# Patient Record
Sex: Female | Born: 1955 | Race: White | Hispanic: No | Marital: Married | Smoking: Former smoker
Health system: Southern US, Community
[De-identification: ages and names within clinical notes are randomized; demographics above are authoritative.]

## PROBLEM LIST (undated history)

## (undated) DIAGNOSIS — T7840XA Allergy, unspecified, initial encounter: Secondary | ICD-10-CM

## (undated) DIAGNOSIS — M1990 Unspecified osteoarthritis, unspecified site: Secondary | ICD-10-CM

## (undated) DIAGNOSIS — H409 Unspecified glaucoma: Secondary | ICD-10-CM

## (undated) HISTORY — PX: OOPHORECTOMY: SHX86

## (undated) HISTORY — DX: Allergy, unspecified, initial encounter: T78.40XA

## (undated) HISTORY — PX: ABDOMINAL HYSTERECTOMY: SHX81

## (undated) HISTORY — DX: Unspecified osteoarthritis, unspecified site: M19.90

---

## 1998-05-20 ENCOUNTER — Other Ambulatory Visit: Admission: RE | Admit: 1998-05-20 | Discharge: 1998-05-20 | Payer: Self-pay | Admitting: Obstetrics and Gynecology

## 1998-07-16 ENCOUNTER — Ambulatory Visit (HOSPITAL_COMMUNITY): Admission: RE | Admit: 1998-07-16 | Discharge: 1998-07-16 | Payer: Self-pay | Admitting: Gastroenterology

## 1999-08-14 ENCOUNTER — Other Ambulatory Visit: Admission: RE | Admit: 1999-08-14 | Discharge: 1999-08-14 | Payer: Self-pay | Admitting: Obstetrics and Gynecology

## 2000-05-25 ENCOUNTER — Other Ambulatory Visit: Admission: RE | Admit: 2000-05-25 | Discharge: 2000-05-25 | Payer: Self-pay | Admitting: Obstetrics and Gynecology

## 2000-07-07 ENCOUNTER — Encounter: Payer: Self-pay | Admitting: Urology

## 2000-07-09 ENCOUNTER — Ambulatory Visit (HOSPITAL_COMMUNITY): Admission: RE | Admit: 2000-07-09 | Discharge: 2000-07-09 | Payer: Self-pay | Admitting: Obstetrics and Gynecology

## 2001-03-29 ENCOUNTER — Observation Stay (HOSPITAL_COMMUNITY): Admission: RE | Admit: 2001-03-29 | Discharge: 2001-03-30 | Payer: Self-pay | Admitting: Obstetrics and Gynecology

## 2001-06-14 ENCOUNTER — Other Ambulatory Visit: Admission: RE | Admit: 2001-06-14 | Discharge: 2001-06-14 | Payer: Self-pay | Admitting: Obstetrics and Gynecology

## 2003-01-17 ENCOUNTER — Other Ambulatory Visit: Admission: RE | Admit: 2003-01-17 | Discharge: 2003-01-17 | Payer: Self-pay | Admitting: Obstetrics and Gynecology

## 2003-03-27 ENCOUNTER — Ambulatory Visit (HOSPITAL_COMMUNITY): Admission: RE | Admit: 2003-03-27 | Discharge: 2003-03-27 | Payer: Self-pay | Admitting: Obstetrics and Gynecology

## 2004-02-15 ENCOUNTER — Other Ambulatory Visit: Admission: RE | Admit: 2004-02-15 | Discharge: 2004-02-15 | Payer: Self-pay | Admitting: Obstetrics and Gynecology

## 2004-03-07 ENCOUNTER — Ambulatory Visit (HOSPITAL_COMMUNITY): Admission: RE | Admit: 2004-03-07 | Discharge: 2004-03-07 | Payer: Self-pay | Admitting: Gastroenterology

## 2005-03-31 ENCOUNTER — Other Ambulatory Visit: Admission: RE | Admit: 2005-03-31 | Discharge: 2005-03-31 | Payer: Self-pay | Admitting: Obstetrics and Gynecology

## 2007-08-09 IMAGING — CT CT ABD-PELV W/O CM
3 of 8 series · 13 of 42 positions shown, 19 images · non-contrast
Comparison: NONE

CLINICAL DATA: Right lower quadrant pain and fever.  Evaluate 
for  appendicitis. 

CT ABDOMEN AND PELVIS WITHOUT AND WITH INTRAVENOUS AND FOLLOWING 
ORAL  CONTRAST
TECHNIQUE: Multiple axial 5-millimeter thick slices at 
5-millimeter intervals were obtained from the lung base through 
the pelvis following the intravenous administration of 100 cc of 
Optiray 350 at a rate of 3 cc per second.  Oral contrast was 
administered as well.  Arterial and venous phase imaging was 
obtained in the upper abdomen with delayed images obtained through 
the pelvis.

[Series 2: wo · axial · 0.68mm/px · z∈[+774,+849]mm · 2 of 45 slices shown]
[im 15/45  soft-tissue]
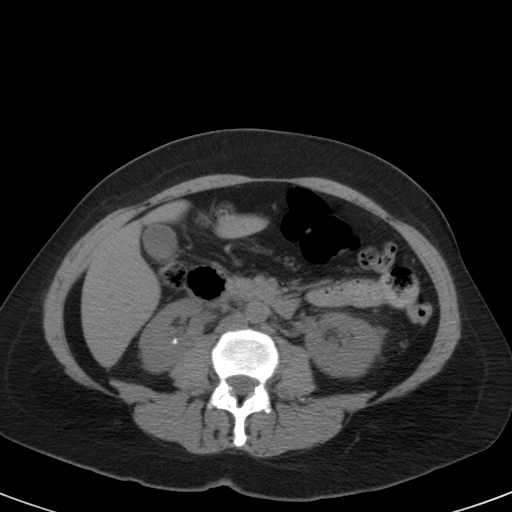
[im 30/45  soft-tissue]
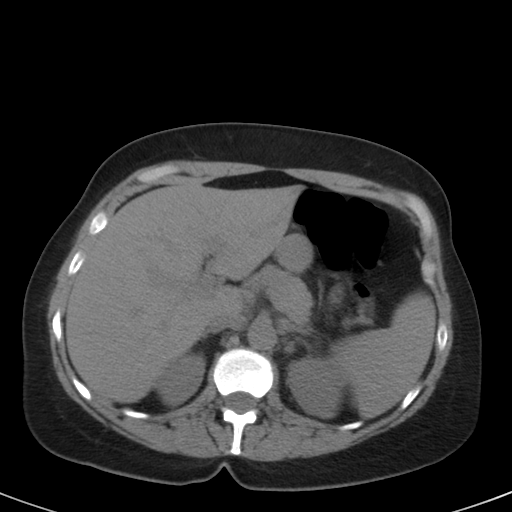

[Series 4: venous · axial · portal-venous · 0.68mm/px · z∈[+574,+880]mm · 8 of 132 slices shown, 13 images]
[im 15/132  soft-tissue]
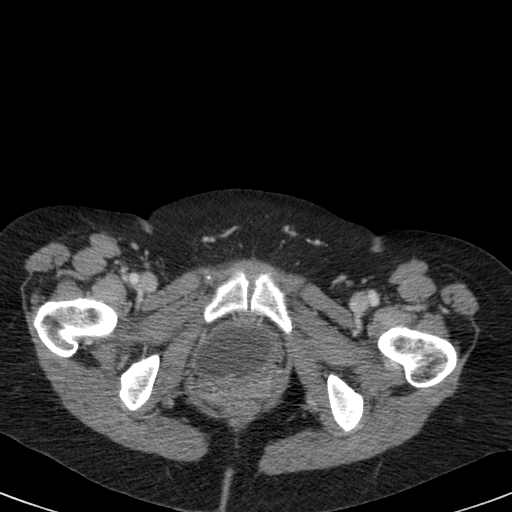
[im 15/132  bone]
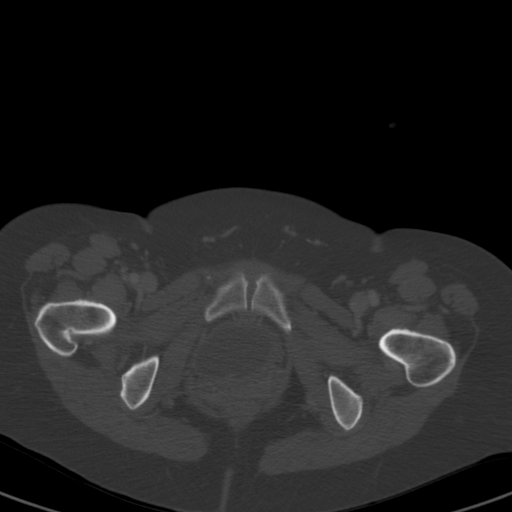
[im 30/132  soft-tissue]
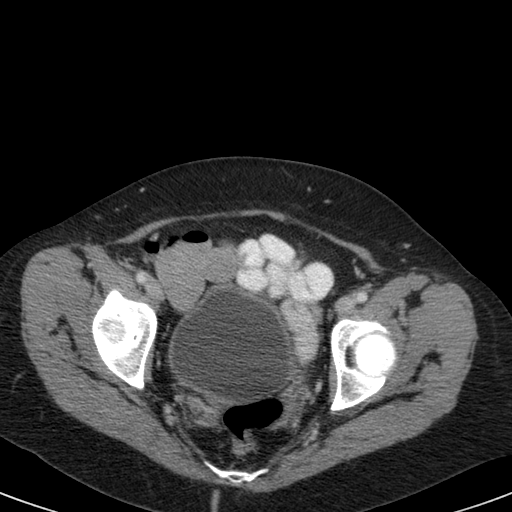
[im 44/132  soft-tissue]
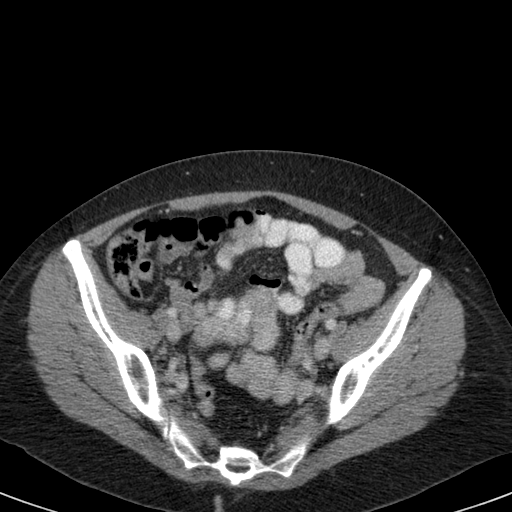
[im 59/132  soft-tissue]
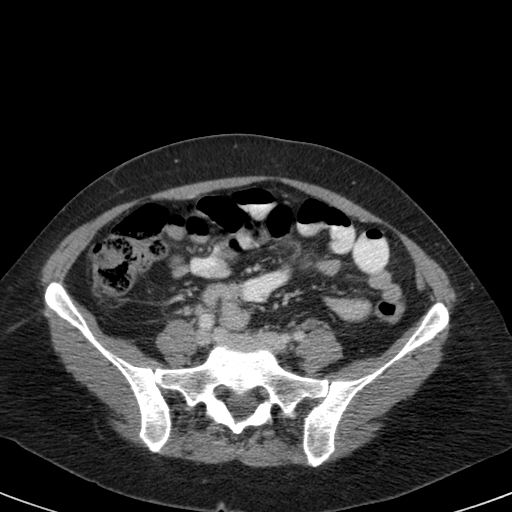
[im 73/132  soft-tissue]
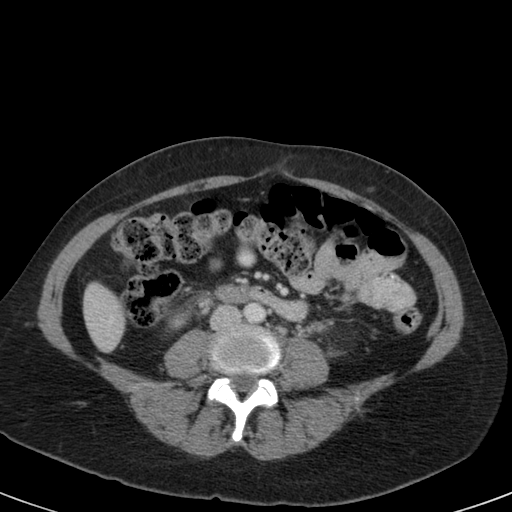
[im 73/132  lung]
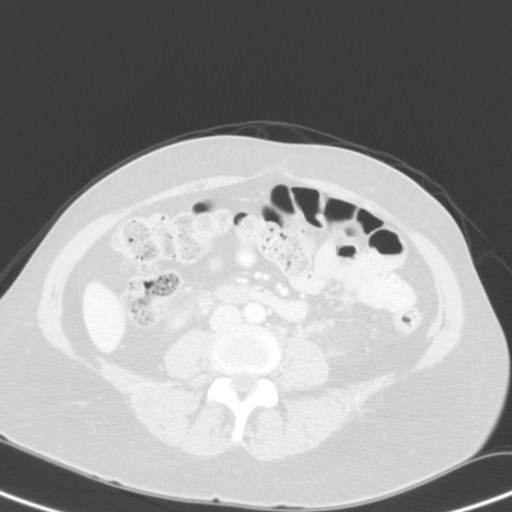
[im 88/132  soft-tissue]
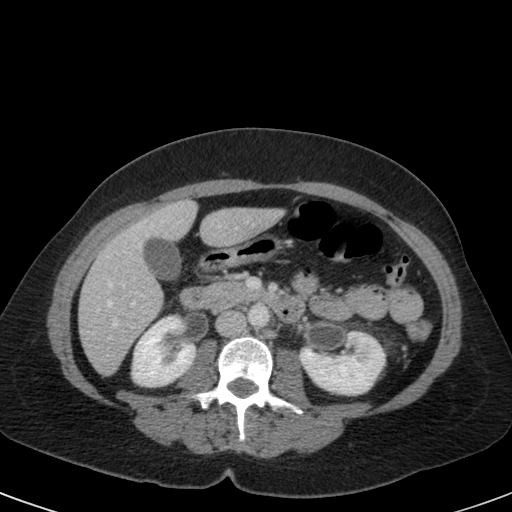
[im 88/132  lung]
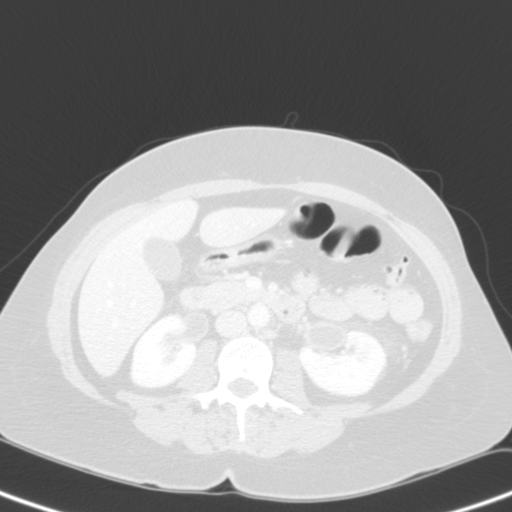
[im 102/132  soft-tissue]
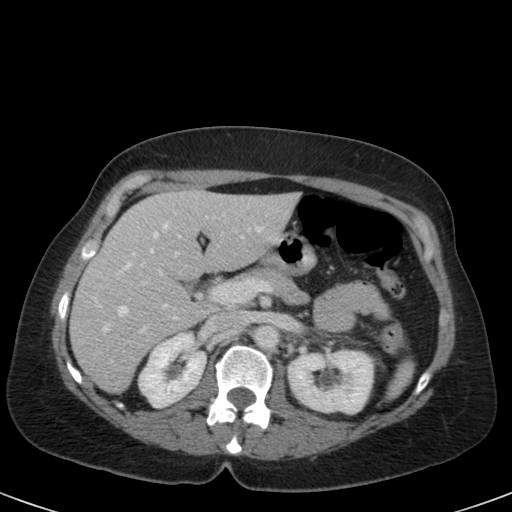
[im 102/132  lung]
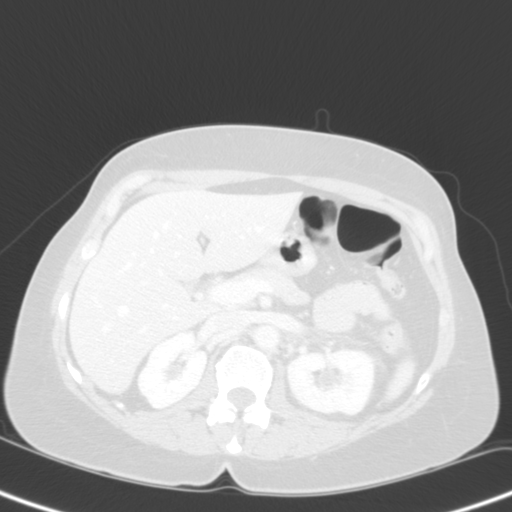
[im 117/132  soft-tissue]
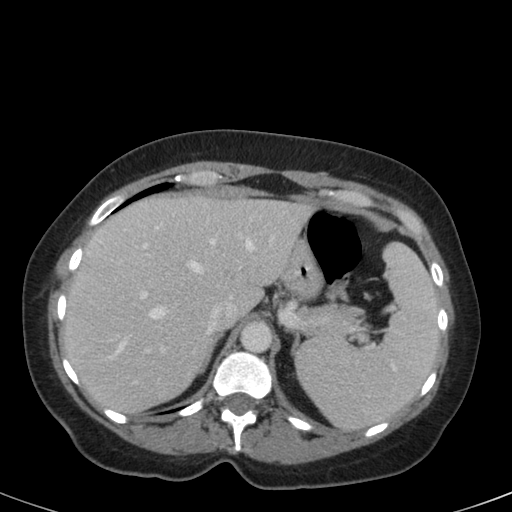
[im 117/132  lung]
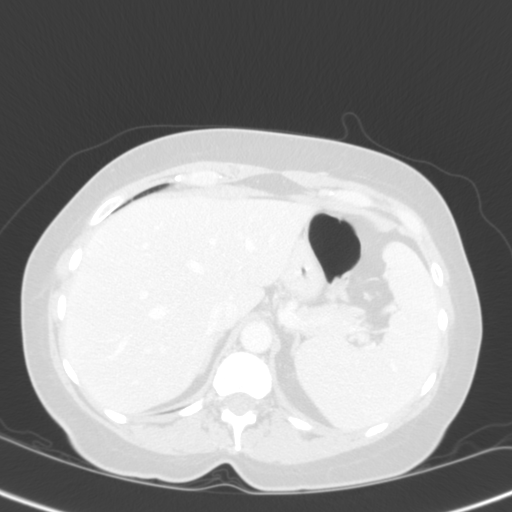

[Series 8058: coronals · coronal · 0.76mm/px · 3 of 72 slices shown, 4 images]
[im 24/72  soft-tissue]
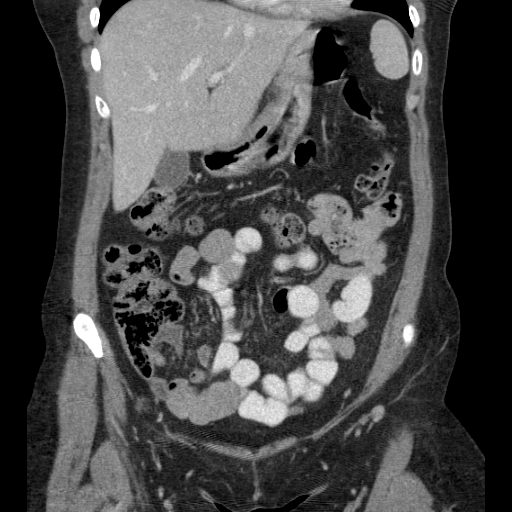
[im 32/72  soft-tissue]
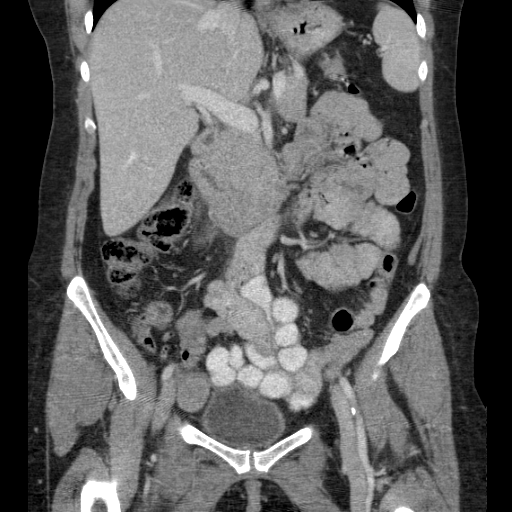
[im 32/72  bone]
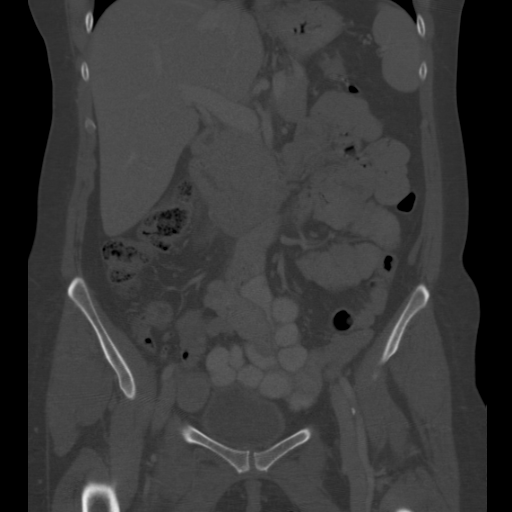
[im 40/72  soft-tissue]
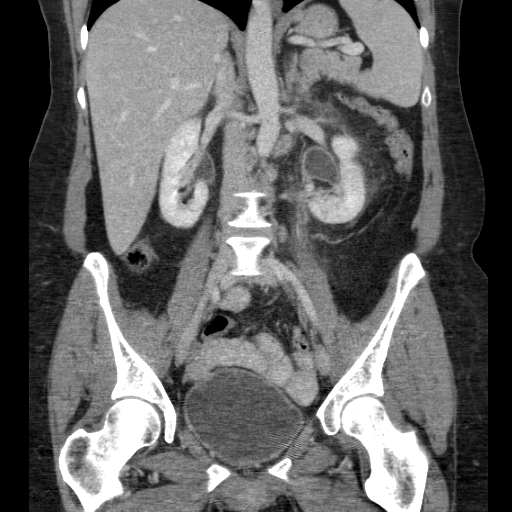

[13 of 42 positions shown; findings below may reference images not displayed]

FINDINGS: There is a 2-mm calculus in the lower pole of the right 
kidney.  No obstruction.  No gallstones. No left renal calculi. 
The liver, pancreas, and spleen are unremarkable. No upper 
abdominal or retroperitoneal mass, adenopathy, or aneurysm. No 
adrenal or renal mass or evidence of renal obstruction. No bowel, 
mesenteric, pelvic, or inguinal mass, adenopathy, or inflammatory 
process. No evidence of appendicitis, diverticulitis, hernia, or 
bowel obstruction. No lung base mass, infiltrate, edema, or 
effusion. No lytic or blastic lesions are identified.
IMPRESSION: Nonobstructing right lower pole renal calculus. No 
mass, adenopathy, or inflammatory process in the abdomen or 
07/20/2006 Dict Date: 07/20/2006  Tran Date: 07/20/2006 DAS  JLM

## 2011-07-01 ENCOUNTER — Emergency Department (INDEPENDENT_AMBULATORY_CARE_PROVIDER_SITE_OTHER)
Admission: EM | Admit: 2011-07-01 | Discharge: 2011-07-01 | Disposition: A | Discharge status: Home / Self Care | Payer: 59 | Source: Home / Self Care | Attending: Emergency Medicine | Admitting: Emergency Medicine

## 2011-07-01 ENCOUNTER — Encounter: Payer: Self-pay | Admitting: *Deleted

## 2011-07-01 DIAGNOSIS — R05 Cough: Secondary | ICD-10-CM

## 2011-07-01 DIAGNOSIS — I10 Essential (primary) hypertension: Secondary | ICD-10-CM

## 2011-07-01 HISTORY — DX: Unspecified glaucoma: H40.9

## 2011-07-01 MED ORDER — DIPHENHYDRAMINE HCL 25 MG PO CAPS: 25.0000 mg | ORAL_CAPSULE | Freq: Once | ORAL | Status: AC

## 2011-07-01 NOTE — ED Notes (Signed)
Pt  Has  Symptoms  Of  Cough  Sneezing  Headache     Sinus  Congestion   -    Has  Had  These  Symptoms    X  sev  Days    -  She  States she  Was  Seen  yest  At  Urgent  Care  And  Was  rx   avelox        And  steriod  Injection    Pt  Requests  Some  Cough  meds  -  She  Was  tild   yest that her BP  Was  High     She  Reports     She  Has  Took     Alka  Seltzer plus

## 2011-07-01 NOTE — ED Provider Notes (Signed)
History     CSN: [id number] Arrival date & time: 07/01/2011  3:54 PM   First MD Initiated Contact with Patient 07/01/11 1559      Chief Complaint  Patient presents with  . Cough    (Consider location/radiation/quality/duration/timing/severity/associated sxs/prior treatment) HPI Comments: Seen two days ago at another Urgent care, he gave me samples of Avalox for 5 days for a sinus infection and bronchitis, also got a steroid shot, was noted to have my BP high, Im still coughing and Im congested"  Patient is a 55 y.o. female presenting with cough. The history is provided by the patient.  Cough This is a new problem. The current episode started more than 1 week ago. The problem has not changed since onset.The cough is non-productive. Associated symptoms include rhinorrhea and sore throat. Pertinent negatives include no shortness of breath. She has tried decongestants and cough syrup for the symptoms. The treatment provided mild relief. She is not a smoker. Her past medical history does not include pneumonia.    Past Medical History  Diagnosis Date  . Glaucoma (increased eye pressure)     Past Surgical History  Procedure Date  . Abdominal hysterectomy   . Oophorectomy     Family History  Problem Relation Age of Onset  . Heart failure Mother   . Rheum arthritis Mother   . Heart failure Father   . COPD Father     History  Substance Use Topics  . Smoking status: Current Everyday Smoker  . Smokeless tobacco: Not on file  . Alcohol Use: Yes     SOCIALLY    OB History    Grav Para Term Preterm Abortions TAB SAB Ect Mult Living                  Review of Systems  Constitutional: Negative for fever and fatigue.  HENT: Positive for sore throat and rhinorrhea.   Eyes: Negative for discharge.  Respiratory: Positive for cough. Negative for shortness of breath.     Allergies  Review of patient's allergies indicates no known allergies.  Home Medications   Current  Outpatient Rx  Name Route Sig Dispense Refill  . MOXIFLOXACIN HCL 400 MG PO TABS Oral Take 400 mg by mouth daily.      Marland Kitchen PHENYLEPH-CPM-DM-APAP 12-09-08-325 MG PO CAPS Oral Take by mouth.      Marland Kitchen DIPHENHYDRAMINE HCL 25 MG PO CAPS Oral Take 1 capsule (25 mg total) by mouth once. One dose at night for 7 days 7 capsule 0    BP 151/97  Pulse 81  Temp(Src) 98.1 F (36.7 C) (Oral)  Resp 17  SpO2 99%  Physical Exam  Nursing note and vitals reviewed. Constitutional: She is oriented to person, place, and time. She appears well-developed and well-nourished.  HENT:  Head: Normocephalic.  Mouth/Throat: Uvula is midline and mucous membranes are normal. No uvula swelling. Posterior oropharyngeal erythema present. No oropharyngeal exudate or posterior oropharyngeal edema.  Eyes: Pupils are equal, round, and reactive to light.  Pulmonary/Chest: Effort normal and breath sounds normal.  Neurological: She is alert and oriented to person, place, and time. She has normal reflexes.  Skin: Skin is warm.    ED Course  Procedures (including critical care time)  Labs Reviewed - No data to display No results found.   1. Cough   2. Hypertension       MDM  Cough and BP recheck- taking avelox and steroid shot- also for BP repeata advised by PCP  nurse?        Jimmie Molly, MD 07/01/11 [phone]

## 2012-05-21 ENCOUNTER — Ambulatory Visit: Payer: 59 | Admitting: Family Medicine

## 2012-05-21 VITALS — BP 162/88 | HR 67 | Temp 97.9°F | Resp 16 | Ht 62.0 in | Wt 153.6 lb

## 2012-05-21 DIAGNOSIS — L259 Unspecified contact dermatitis, unspecified cause: Secondary | ICD-10-CM

## 2012-05-21 DIAGNOSIS — R030 Elevated blood-pressure reading, without diagnosis of hypertension: Secondary | ICD-10-CM

## 2012-05-21 MED ORDER — PREDNISONE 20 MG PO TABS: ORAL_TABLET | ORAL | Status: AC

## 2012-05-21 NOTE — Progress Notes (Signed)
[address]   [place], [place]  [address]   [phone]  Subjective:    Patient ID: Brandi Williams, female    DOB: 1956-04-28, 56 y.o.   MRN: [id number]  HPI This 56 y.o. female presents for evaluation of rash.  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  Onset one week ago.  B legs medial aspect.  No other lesions.  +itching.  Calamine lotion; has also used apple cider vinegar.  Never has had poison ivy.  Applying hydrocortisone.    2.  Blood pressure elevated:  Issue for past year. PCP has been following; checks periodically at daughter's house.  Eats a lot of salt.  PCP: [facility] in [place] PMH:  Glaucoma, HTN, OA toes, hands, knees Psurg: hysterectomy DUB. All:  NKDA Medications: glaucoma drops Social:  Married; 2 children, 1 grandchild.  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  +tobacco; no alcohol.    Review of Systems  Constitutional: Negative for fever, chills, diaphoresis and fatigue.  Cardiovascular: Negative for chest pain and leg swelling.  Skin: Positive for rash. Negative for color change, pallor and wound.  Neurological: Negative for dizziness, weakness, light-headedness, numbness and headaches.    Past Medical History  Diagnosis Date  . Glaucoma (increased eye pressure)     Past Surgical History  Procedure Date  . Oophorectomy   . Abdominal hysterectomy     DUB    Prior to Admission medications   Medication Sig Start Date End Date Taking? Authorizing Provider  moxifloxacin (AVELOX) 400 MG tablet Take 400 mg by mouth daily.      Historical Provider, MD  Phenyleph-CPM-DM-APAP (ALKA-SELTZER PLUS COLD & COUGH) 12-09-08-325 MG CAPS Take by mouth.      Historical Provider, MD  predniSONE (DELTASONE) 20 MG tablet 2 daily x 4 days, then 1 daily x 4 days 05/21/12   Ethelda Chick, MD    No Known Allergies  History   Social History  .  Marital Status: Married    Spouse Name: N/A    Number of Children: N/A  . Years of Education: N/A   Occupational History  . Not on file.   Social History Main Topics  . Smoking status: Current Every Day Smoker  . Smokeless tobacco: Not on file  . Alcohol Use: Yes     SOCIALLY  . Drug Use:   . Sexually Active:    Other Topics Concern  . Not on file   Social History Narrative   Marital status: married.   Children: 2; one grandchild.   Employment: works for [employer] in [employer] since 2007.   Tobacco: yes   Alcohol: yes   Exercise: no formal exercise; job physically demanding.    Family History  Problem Relation Age of Onset  . Heart failure Mother   . Rheum arthritis Mother   . Heart failure Father   . COPD Father        Objective:   Physical Exam  Nursing note and vitals reviewed. Constitutional: She appears well-developed and well-nourished. No distress.  Eyes: Conjunctivae normal are normal. Pupils are equal, round, and reactive to light.  Pulmonary/Chest: Effort normal.  Skin: Rash noted. She is not diaphoretic. There is erythema. No pallor.       Maculopapular rash with scattered B medial legs/thighs; no vesicles or pustules; minimal induration.  No streaking.  Psychiatric: She has a normal mood and affect. Her behavior is normal. Judgment and thought content normal.       Assessment & Plan:   1. Contact dermatitis  predniSONE (DELTASONE) 20 MG tablet  2. Blood pressure elevated      1. Contact Dermatitis: New.  B medial thighs.  Consistent with contact dermatitis possibly from poison ivy.  Recommend Claritin 10mg  daily; Benadryl 25mg  qhs.  Topical Benadryl cream.  Rx for Prednisone provided.  Local wound care.  RTC if no improvement in two weeks. 2. Blood pressure elevated:  New to this provider; chronic issue for patient for past year.  Recommend weight loss, exercise, low salt diet.  Check blood pressure weekly for next several months.  RTC or PCP  if BP> 140/90 consistently.

## 2012-05-21 NOTE — Patient Instructions (Addendum)
1. Contact dermatitis  predniSONE (DELTASONE) 20 MG tablet  2. Blood pressure elevated      FOR RASH: START CLARITIN (LORATADINE) 10MG  ONE DAILY FOR ITCHING. START BENADRYL 25MG  ONE TABLET AT BEDTIME FOR ITCHING. START BENADRYL CREAM APPLY TO AFFECTED AREAS AS NEEDED.  START PREDNISONE TAPER IF WORSENS OR PERSISTS.    FOR BLOOD PRESSURE: RECOMMEND 10-20 POUND WEIGHT LOSS. AVOID ADDING SALT TO FOODS. RECOMMEND DAILY EXERCISE.   CHECK BLOOD PRESSURE WEEKLY.

## 2012-05-23 ENCOUNTER — Encounter: Payer: Self-pay | Admitting: Family Medicine

## 2012-06-04 NOTE — Progress Notes (Signed)
Reviewed and agree.

## 2012-07-08 ENCOUNTER — Encounter (HOSPITAL_COMMUNITY): Payer: Self-pay | Admitting: Emergency Medicine

## 2012-07-08 ENCOUNTER — Emergency Department (INDEPENDENT_AMBULATORY_CARE_PROVIDER_SITE_OTHER)
Admission: EM | Admit: 2012-07-08 | Discharge: 2012-07-08 | Disposition: A | Discharge status: Home / Self Care | Payer: 59 | Source: Home / Self Care | Attending: Emergency Medicine | Admitting: Emergency Medicine

## 2012-07-08 ENCOUNTER — Emergency Department (HOSPITAL_COMMUNITY)
Admit: 2012-07-08 | Discharge: 2012-07-08 | Disposition: A | Discharge status: Home / Self Care | Payer: 59 | Attending: Emergency Medicine | Admitting: Emergency Medicine

## 2012-07-08 DIAGNOSIS — IMO0001 Reserved for inherently not codable concepts without codable children: Secondary | ICD-10-CM

## 2012-07-08 DIAGNOSIS — M7918 Myalgia, other site: Secondary | ICD-10-CM

## 2012-07-08 DIAGNOSIS — M25519 Pain in unspecified shoulder: Secondary | ICD-10-CM | POA: Insufficient documentation

## 2012-07-08 NOTE — ED Provider Notes (Signed)
Medical screening examination/treatment/procedure(s) were performed by non-physician practitioner and as supervising physician I was immediately available for consultation/collaboration.  Leslee Home, M.D.   Reuben Likes, MD 07/08/12 2046

## 2012-07-08 NOTE — ED Provider Notes (Signed)
History     CSN: [id number]  Arrival date & time 07/08/12  1653   First MD Initiated Contact with Patient 07/08/12 1817      Chief Complaint  Patient presents with  . Shoulder Pain    (Consider location/radiation/quality/duration/timing/severity/associated sxs/prior treatment) The history is provided by the patient.   Brandi Williams is a 56 y.o. female who complains of left clavicle pain for 1 day. Mechanism of injury: none noted.  Reports pain is worse with palpation.  denies change in arm or hand function or strength.  Applied heat to area, not medication taken.  Symptoms have been increasingly worse.  No prior history of related problems with shoulder or clavicle.  Currently works in Patent examiner for a hotel.    Past Medical History  Diagnosis Date  . Glaucoma (increased eye pressure)   . Allergy   . Arthritis     Past Surgical History  Procedure Date  . Oophorectomy   . Abdominal hysterectomy     DUB    Family History  Problem Relation Age of Onset  . Heart failure Mother   . Rheum arthritis Mother   . Heart failure Father   . COPD Father   . Cancer Sister     Lung  . Cancer Brother     colon    History  Substance Use Topics  . Smoking status: Current Every Day Smoker -- 1.0 packs/day    Types: Cigarettes  . Smokeless tobacco: Not on file  . Alcohol Use: Yes     Comment: SOCIALLY    OB History    Grav Para Term Preterm Abortions TAB SAB Ect Mult Living                  Review of Systems  Musculoskeletal: Positive for arthralgias.  All other systems reviewed and are negative.    Allergies  Review of patient's allergies indicates no known allergies.  Home Medications   Current Outpatient Rx  Name  Route  Sig  Dispense  Refill  . MOXIFLOXACIN HCL 400 MG PO TABS   Oral   Take 400 mg by mouth daily.           Marland Kitchen PHENYLEPH-CPM-DM-APAP 12-09-08-325 MG PO CAPS   Oral   Take by mouth.           Marland Kitchen PREDNISONE 20 MG PO TABS      2  daily x 4 days, then 1 daily x 4 days   12 tablet   0     BP 144/90  Pulse 74  Temp 98.5 F (36.9 C) (Oral)  Resp 18  SpO2 100%  Physical Exam  Nursing note and vitals reviewed. Constitutional: She is oriented to person, place, and time. Vital signs are normal. She appears well-developed and well-nourished. She is active and cooperative.  HENT:  Head: Normocephalic.  Eyes: Conjunctivae normal are normal. Pupils are equal, round, and reactive to light. No scleral icterus.  Neck: Trachea normal and normal range of motion. Neck supple.  Cardiovascular: Normal rate, regular rhythm, normal heart sounds and intact distal pulses.   Pulmonary/Chest: Effort normal and breath sounds normal.    Lymphadenopathy:    She has no cervical adenopathy.  Neurological: She is alert and oriented to person, place, and time. She has normal strength. No cranial nerve deficit or sensory deficit. Coordination and gait normal. GCS eye subscore is 4. GCS verbal subscore is 5. GCS motor subscore is 6.  Skin: Skin  is warm and dry.  Psychiatric: She has a normal mood and affect. Her speech is normal and behavior is normal. Judgment and thought content normal. Cognition and memory are normal.    ED Course  Procedures (including critical care time)  Labs Reviewed - No data to display Dg Clavicle Left  07/08/2012  *RADIOLOGY REPORT*  Clinical Data: Medial left clavicle pain.  No known injuries.  LEFT CLAVICLE - 2+ VIEWS  Comparison: None.  Findings: No evidence of acute, subacute, or healed fractures.  No intrinsic abnormalities involving the clavicle.  Sternoclavicular joint and acromioclavicular joint intact; note made of vacuum phenomenon in the acromioclavicular joint, a benign finding.  IMPRESSION: No acute, subacute, or significant abnormalities.   Original Report Authenticated By: Hulan Saas, M.D.      1. Musculoskeletal pain       MDM  Pt insists on xray of clavicle despite no apparent  injury.   nsaids or tylenol prn, rtc prn    .  Johnsie Kindred, NP 07/08/12 1943

## 2012-07-08 NOTE — ED Notes (Signed)
Pt c/o left clavicle pain x 1 week.... Noticed a rise on the medial part of clavicle since yesterday.... Describes minor pain w/activity... Denies: inj/trauma, fevers, vomiting, nauseas, diarrhea... Pt is alert w/no signs of distress.

## 2012-07-08 NOTE — ED Notes (Signed)
Patient transported to X-ray 

## 2013-05-03 ENCOUNTER — Ambulatory Visit (HOSPITAL_COMMUNITY): Admission: RE | Admit: 2013-05-03 | Payer: 59 | Source: Ambulatory Visit | Admitting: Obstetrics and Gynecology

## 2013-05-03 ENCOUNTER — Encounter (HOSPITAL_COMMUNITY): Admission: RE | Payer: Self-pay | Source: Ambulatory Visit

## 2013-05-03 SURGERY — ANTERIOR (CYSTOCELE) AND POSTERIOR REPAIR (RECTOCELE)
Anesthesia: Choice

## 2013-07-29 IMAGING — CR DG CLAVICLE*L*
2 series · 2 of 2 positions shown · non-contrast
Comparison: None.

CLINICAL DATA: Medial left clavicle pain.  No known injuries.

LEFT CLAVICLE - 2+ VIEWS

[w clavicle ap left *]
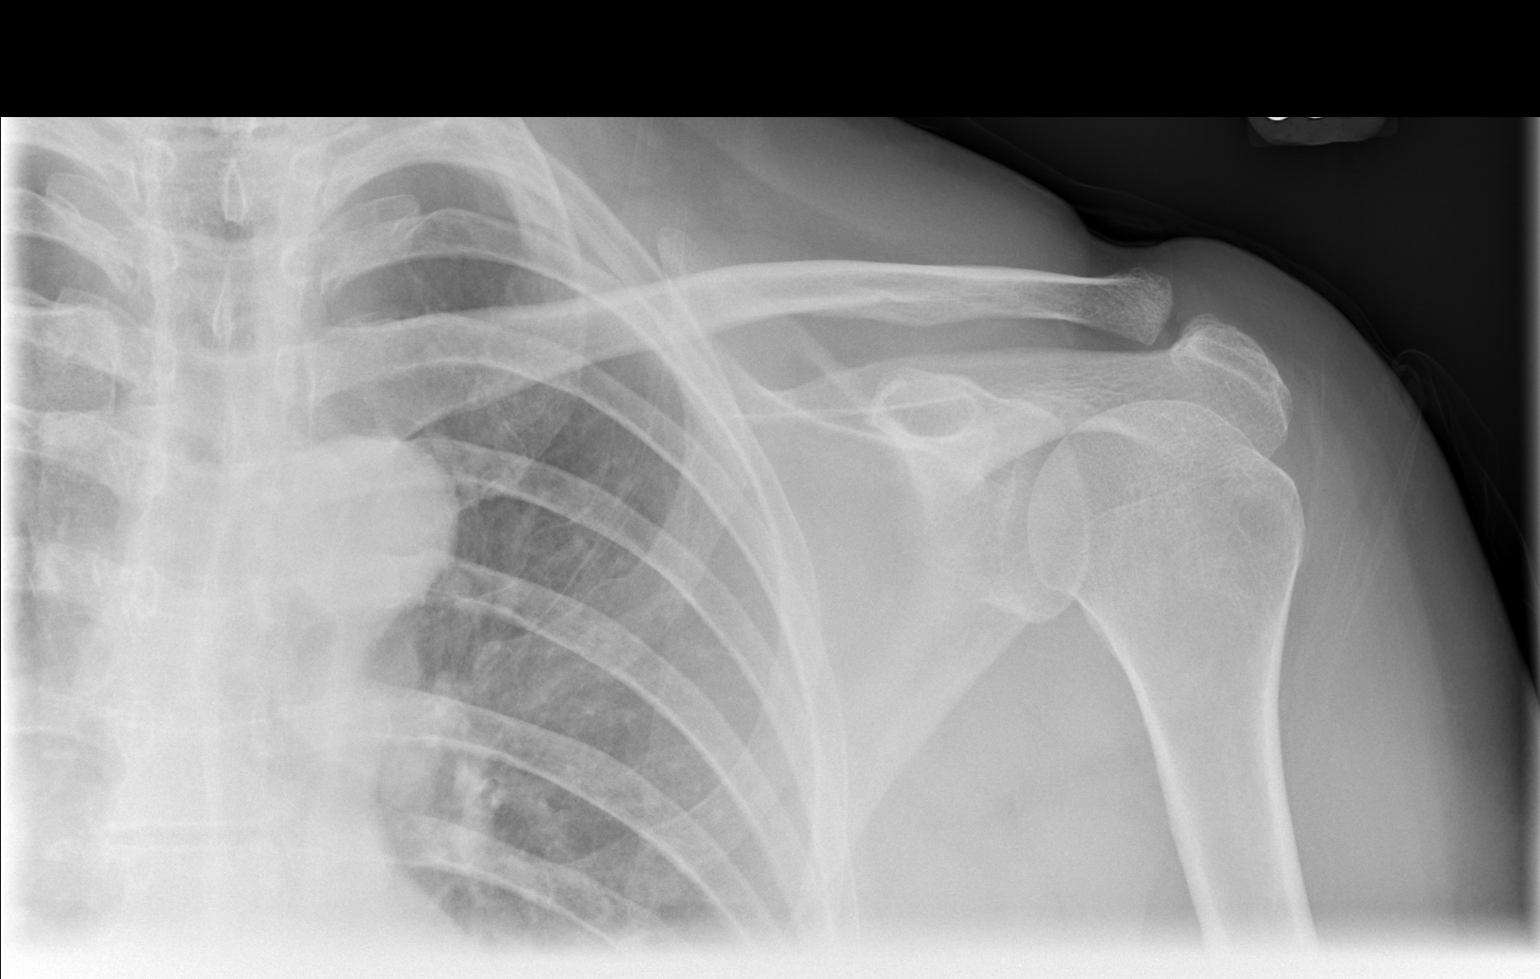

[w clavicle tangential left *]
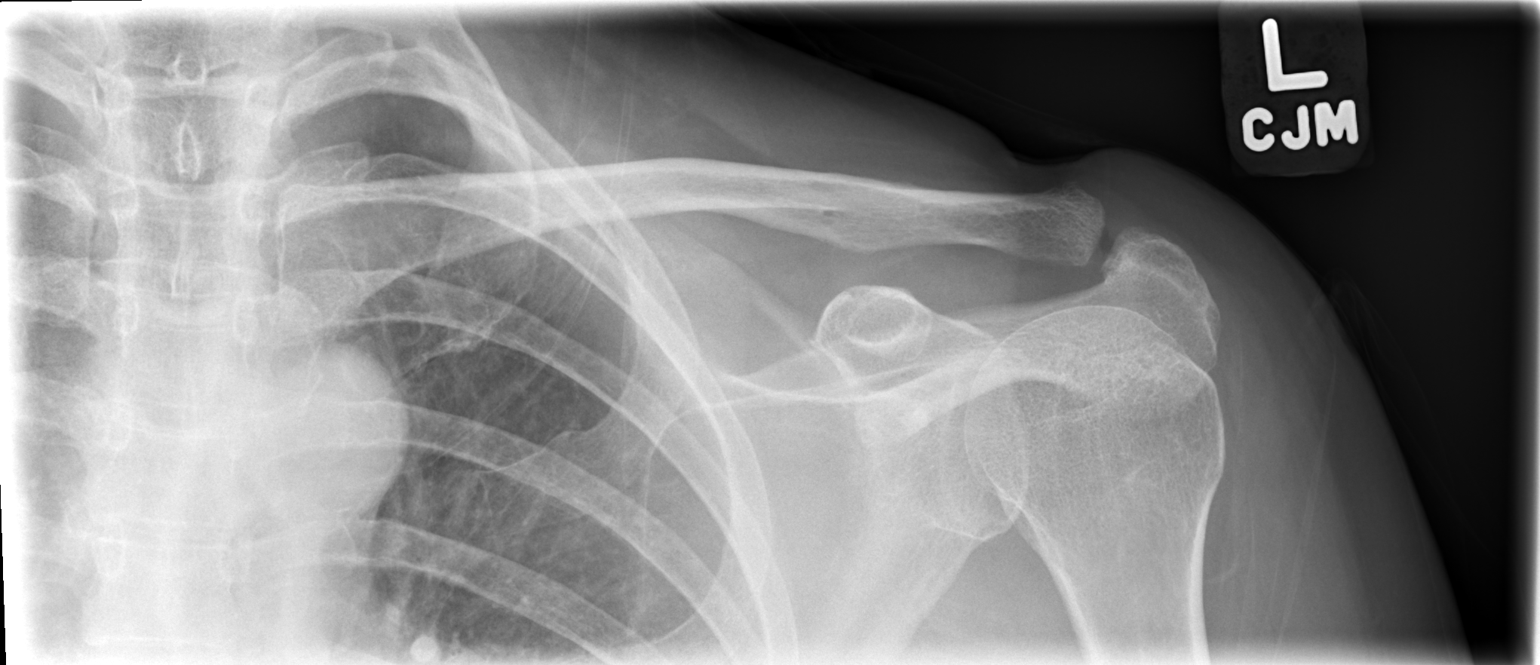

[2 of 2 positions shown; findings below may reference images not displayed]

FINDINGS: No evidence of acute, subacute, or healed fractures.  No
intrinsic abnormalities involving the clavicle.  Sternoclavicular
joint and acromioclavicular joint intact; note made of vacuum
phenomenon in the acromioclavicular joint, a benign finding.
IMPRESSION: No acute, subacute, or significant abnormalities.

## 2013-11-20 ENCOUNTER — Ambulatory Visit (INDEPENDENT_AMBULATORY_CARE_PROVIDER_SITE_OTHER): Payer: 59

## 2013-11-20 VITALS — BP 130/84 | HR 80 | Resp 18

## 2013-11-20 DIAGNOSIS — R52 Pain, unspecified: Secondary | ICD-10-CM

## 2013-11-20 DIAGNOSIS — M1990 Unspecified osteoarthritis, unspecified site: Secondary | ICD-10-CM

## 2013-11-20 DIAGNOSIS — M109 Gout, unspecified: Secondary | ICD-10-CM

## 2013-11-20 DIAGNOSIS — M2010 Hallux valgus (acquired), unspecified foot: Secondary | ICD-10-CM

## 2013-11-20 DIAGNOSIS — M2040 Other hammer toe(s) (acquired), unspecified foot: Secondary | ICD-10-CM

## 2013-11-20 DIAGNOSIS — M7750 Other enthesopathy of unspecified foot: Secondary | ICD-10-CM

## 2013-11-20 DIAGNOSIS — M779 Enthesopathy, unspecified: Secondary | ICD-10-CM

## 2013-11-20 DIAGNOSIS — M778 Other enthesopathies, not elsewhere classified: Secondary | ICD-10-CM

## 2013-11-20 MED ORDER — MELOXICAM 15 MG PO TABS: 15.0000 mg | ORAL_TABLET | Freq: Every day | ORAL | Status: AC

## 2013-11-20 MED ORDER — TRIAMCINOLONE ACETONIDE 10 MG/ML IJ SUSP: 10.0000 mg | Freq: Once | INTRAMUSCULAR | Status: AC

## 2013-11-20 NOTE — Progress Notes (Signed)
   Subjective:    Patient ID: Brandi Williams, female    DOB: 1955/12/19, 58 y.o.   MRN: [id number]  HPI my left foot has been hurting for a week now and throb on the big toe and the 2nd toe and hurts with shoes and is swollen     Review of Systems  Psychiatric/Behavioral: Positive for behavioral problems.  All other systems reviewed and are negative.      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 X-rays taken at this time reveal digital contractures HAV deformity pin fixation first metatarsal left foot there is significant fracture lesser digits 2345 no signs of fracture or osseous abnormality generalized osteopenic changes identified no history of recent trauma or other changes patient did a lot of walking shoes may be inadequate as far support or stability. The shoes wearing a new balance type shoe to       Assessment & Plan:  Assessment this time based on clinical findings vascular status is intact pedal pulses palpable epicritic and proprioceptive sensations intact and symmetric patient does have osteoarthropathy cannot rule out possibly gout attack which swelling affecting his second toe cannot rule out or capsulitis of the second MTP and early neuroma symptomology. Versus a gout attack. Also cannot rule out rheumatoid arthropathy as well my request for arthritis palliative and this time patient will have blood work done with the next day  as instructed prescription for meloxicam is also issued 50 mg once daily suggested posse some ice for a day or to help calm down the foot maintain a good stiff soled shoe no barefoot or flimsy shoes currently wearing flip-flops and ice she should not or flip-flops. Recheck in one week for followup advised to contact is any change difficulties in the interim.  Alvan Dame DPM

## 2013-11-20 NOTE — Patient Instructions (Signed)
Gout  Gout is when your joints become red, sore, and swell (inflammed). This is caused by the buildup of uric acid crystals in the joints. Uric acid is a chemical that is normally in the blood. If the level of uric acid gets too high in the blood, these crystals form in your joints and tissues. Over time, these crystals can form into masses near the joints and tissues. These masses can destroy bone and cause the bone to look misshapen (deformed).  HOME CARE   · Do not take aspirin for pain.  · Only take medicine as told by your doctor.  · Rest the joint as much as you can. When in bed, keep sheets and blankets off painful areas.  · Keep the sore joints raised (elevated).  · Put warm or cold packs on painful joints. Use of warm or cold packs depends on which works best for you.  · Use crutches if the painful joint is in your leg.  · Drink enough fluids to keep your pee (urine) clear or pale yellow. Limit alcohol, sugary drinks, and drinks with fructose in them.  · Follow your diet instructions. Pay careful attention to how much protein you eat. Include fruits, vegetables, whole grains, and fat-free or low-fat milk products in your daily diet. Talk to your doctor or dietician about the use of coffee, vitamin C, and cherries. These may help lower uric acid levels.  · Keep a healthy body weight.  GET HELP RIGHT AWAY IF:   · You have watery poop (diarrhea), throw up (vomit), or have any side effects from medicines.  · You do not feel better in 24 hours, or you are getting worse.  · Your joint becomes suddenly more tender, and you have chills or a fever.  MAKE SURE YOU:   · Understand these instructions.  · Will watch your condition.  · Will get help right away if you are not doing well or get worse.  Document Released: 05/05/2008 Document Revised: 11/21/2012 Document Reviewed: 11/04/2009  [employer]® Patient Information ©2014 [employer].

## 2013-11-21 ENCOUNTER — Other Ambulatory Visit: Payer: Self-pay

## 2013-11-21 LAB — C-REACTIVE PROTEIN: CRP: <0.5 mg/dL (ref <0.60)

## 2013-11-21 LAB — RHEUMATOID FACTOR: Rhuematoid fact SerPl-aCnc: 18 IU/mL — ABNORMAL HIGH (ref <=14)

## 2013-11-21 LAB — URIC ACID: URIC ACID, SERUM: 3.7 mg/dL (ref 2.4–7.0)

## 2013-11-21 LAB — SEDIMENTATION RATE: Sed Rate: 8 mm/hr (ref 0–22)

## 2013-11-22 LAB — ANA: Anti Nuclear Antibody(ANA): NEGATIVE

## 2013-11-24 ENCOUNTER — Telehealth: Payer: Self-pay | Admitting: *Deleted

## 2013-11-24 NOTE — Telephone Encounter (Signed)
I left the patient a message that her Uric Acid level was normal.  Rheumatoid Factor is elevated which may indicate Rheumatoid Arthritis.  He will discuss it in more detail on next visit which is 11/28/13.

## 2013-11-24 NOTE — Telephone Encounter (Signed)
Yes I did review her lab results uric acid levels were normal patient had a slightly elevated rheumatoid factor indicating the possibility of rheumatoid arthritis. Her sedimentation rate and other findings were all in normal range. If she continues to have pain and arthritic pain she may be candidate for referral to a rheumatologist. I suggest she keep her followup appointment with me to discuss the progress or changes in the results of her testing and we can make arrangements for her to see appropriate rheumatologist as needed  Alvan Dame DPM

## 2013-11-24 NOTE — Telephone Encounter (Signed)
Have they gotten results of my blood test?

## 2013-11-28 ENCOUNTER — Ambulatory Visit (INDEPENDENT_AMBULATORY_CARE_PROVIDER_SITE_OTHER): Payer: 59

## 2013-11-28 VITALS — BP 142/88 | HR 67 | Resp 12

## 2013-11-28 DIAGNOSIS — M069 Rheumatoid arthritis, unspecified: Secondary | ICD-10-CM

## 2013-11-28 DIAGNOSIS — M1990 Unspecified osteoarthritis, unspecified site: Secondary | ICD-10-CM

## 2013-11-28 DIAGNOSIS — M2010 Hallux valgus (acquired), unspecified foot: Secondary | ICD-10-CM

## 2013-11-28 NOTE — Patient Instructions (Signed)
Rheumatoid Arthritis Rheumatoid arthritis is a long-term (chronic) inflammatory disease that causes pain, swelling, and stiffness of the joints. It can affect the entire body, including the eyes and lungs. The effects of rheumatoid arthritis vary widely among those with the condition. CAUSES  The cause of rheumatoid arthritis is not known. It tends to run in families and is more common in women. Certain cells of the body's natural defense system (immune system) do not work properly and begin to attack healthy joints. It primarily involves the connective tissue that lines the joints (synovial membrane). This can cause damage to the joint. SYMPTOMS   Pain, stiffness, swelling, and decreased motion of many joints, especially in the hands and feet.  Stiffness that is worse in the morning. It may last 1 2 hours or longer.  Numbness and tingling in the hands.  Fatigue.  Loss of appetite.  Weight loss.  Low-grade fever.  Dry eyes and mouth.  Firm lumps (rheumatoid nodules) that grow beneath the skin in areas such as the elbows and hands. DIAGNOSIS  Diagnosis is based on the symptoms described, an exam, and blood tests. Sometimes, X-rays are helpful. TREATMENT  The goals of treatment are to relieve pain, reduce inflammation, and to slow down or stop joint damage and disability. Methods vary and may include:  Maintaining a balance of rest, exercise, and proper nutrition.  Medicines:  Pain relievers (analgesics).  Corticosteroids and nonsteroidal anti-inflammatory drugs (NSAIDs) to reduce inflammation.  Disease-modifying antirheumatic drugs (DMARDs) to try to slow the course of the disease.  Biologic response modifiers to reduce inflammation and damage.  Physical therapy and occupational therapy.  Surgery for patients with severe joint damage. Joint replacement or fusing of joints may be needed.  Routine monitoring and ongoing care, such as office visits, blood and urine tests, and  X-rays. HOME CARE INSTRUCTIONS   Remain physically active and reduce activity when the disease gets worse.  Eat a well-balanced diet.  Put heat on affected joints when you wake up and before activities. Keep the heat on the affected joint for as long as directed by your caregiver.  Put ice on affected joints following activities or exercising.  Put ice in a plastic bag.  Place a towel between your skin and the bag.  Leave the ice on for 15-20 minutes, 03-04 times a day.  Take all medicines and supplements as directed by your caregiver.  Use splints as directed by your caregiver. Splints help maintain joint position and function.  Do not sleep with pillows under your knees. This may lead to spasms.  Participate in a self-management program to keep current with the latest treatment and coping skills. SEEK IMMEDIATE MEDICAL CARE IF:  You have fainting episodes.  You have periods of extreme weakness.  You rapidly develop a hot, painful joint that is more severe than usual joint aches.  You have chills.  You have a fever. MAKE SURE YOU:  Understand these instructions.  Will watch your condition.  Will get help right away if you are not doing well or get worse. FOR MORE INFORMATION  American College of Rheumatology: www.rheumatology.org Arthritis Foundation: www.arthritis.org Document Released: 07/24/2000 Document Revised: 01/26/2012 Document Reviewed: 09/02/2011 [employer] Patient Information 2014 [employer].  

## 2013-11-28 NOTE — Progress Notes (Signed)
   Subjective:    Patient ID: Brandi Williams, female    DOB: [birth year], 58 y.o.   MRN: [id number]  HPI '' LT FOOT IS DOING OK. THE RX. MELOXICAM IS  HELPING EXCEPT  MAKING MY GUM SORE.''    Review of Systems no systemic changes in new findings noted.     Objective:   Physical Exam Neurovascular status is intact patient is a history of previous bunion surgery patient does have findings consistent with capsulitis versus early neuroma symptomology ulcer in the first interspace versus gout attack however this time labs indicate uric acid levels was normal however is elevated rheumatoid factor was noted on the arthritis panel. As such possibly early rheumatoid arthropathy can't be ruled out should note neurovascular status is intact pedal pulses are palpable the foot in general is feeling better she been taking the diclofenac however that is possibly causing her some mouth irritation for gum irritation may discontinue this was complaint ibuprofen or Advil       Assessment & Plan:  Assessment this time is arthropathy cannot rule out rheumatoid arthropathy affecting her great toe joint and left forefoot. Plan at this time possible referral to rheumatology for evaluation followup in the interim maintain NSAID therapy as needed appropriate coming shoes as needed reappointed for future followup and recheck there is a severe flareups on an as-needed basis. At this time and arrangements for referral to Dr. Dareen Piano with [facility] for rheumatology comments  Brandi Williams [facility]

## 2013-12-05 ENCOUNTER — Telehealth: Payer: Self-pay | Admitting: *Deleted

## 2013-12-05 NOTE — Telephone Encounter (Signed)
Patient has an appointment with Dr. Gwynneth Albright on 12/04/2013 at 10:30am.

## 2014-01-15 ENCOUNTER — Ambulatory Visit (INDEPENDENT_AMBULATORY_CARE_PROVIDER_SITE_OTHER): Payer: 59

## 2014-01-15 VITALS — BP 144/86 | HR 89 | Resp 12

## 2014-01-15 DIAGNOSIS — B351 Tinea unguium: Secondary | ICD-10-CM

## 2014-01-15 DIAGNOSIS — R52 Pain, unspecified: Secondary | ICD-10-CM

## 2014-01-15 DIAGNOSIS — M069 Rheumatoid arthritis, unspecified: Secondary | ICD-10-CM

## 2014-01-15 DIAGNOSIS — L600 Ingrowing nail: Secondary | ICD-10-CM

## 2014-01-15 MED ORDER — CEPHALEXIN 500 MG PO CAPS: 500.0000 mg | ORAL_CAPSULE | Freq: Three times a day (TID) | ORAL | Status: DC

## 2014-01-15 NOTE — Progress Notes (Signed)
   Subjective:    Patient ID: Brandi Williams, female    DOB: 1956/04/01, 58 y.o.   MRN: [id number]  HPI ''RT FOOT GREAT TOENAIL NEED TO BE TAKING OUT AND LT FOOT STILL PAINFUL.'' PT STATED HAVE APPT. TO DR. ANDERSON  NEXT MONTH.   Review of Systems no new systemic findings or changes noted     Objective:   Physical Exam Neurovascular status is intact pedal pulses palpable DP and PT +2/4 capillary refill time 3 seconds all digits skin temperature warm turgor normal no edema rubor pallor or varicosities patient has significant rheumatoid arthropathy his feet and walking gait ambulation history is it nail excision of the left hallux, right hallux show thickening yellowing discoloration proptosis incurvation requesting permanent nail excision with removal risk O. is reviewed with the patient patient's pedal pulses are palpable DP postal for PT one over 4 bilateral Refill time 3 seconds all digits epicritic and proprioceptive sensations intact and symmetric. Patient also has long-standing history of arthropathy she can be following with Dr. Dareen Piano centimeter near future over the interim did request a handicap parking shoes difficulty with walking or standing for long period times at this time we'll give patient 12 months of handicap parking privileges vouchers given at this time       Assessment & Plan:  Assessment ingrowing nail onychomycosis and dystrophy of the left right hallux nail bed nail plate patient is requesting permanent nail excision at this time local block is administered Betadine prep performed the nail plate is avulsed phenol matricectomy followed by alcohol wash Betadine ointment presto dressing applied patient given instructions for soaking Betadine warm water recommended cephalexin as needed for pain for antibiotic regimen recommended Tylenol as the for pain patient is following with Dr. Danise Edge for her rheumatoid arthritis patient also given a handicapped temporary handicap  for 12 months duration handicap parking is needed due to her rheumatoid arthropathy and digital contractures well followup in 2-3 weeks for nail check fasting testing difficulties and trim. Cephalexin 5 mg daily x10 days for a bike also recommended, as the for pain  Alvan Dame DPM

## 2014-01-15 NOTE — Patient Instructions (Signed)

## 2014-01-17 ENCOUNTER — Telehealth: Payer: Self-pay | Admitting: *Deleted

## 2014-01-17 NOTE — Telephone Encounter (Signed)
I came in Monday and saw Dr. Ralene Cork.  He took my big toenail off my right foot.  It's bleeding something terrible.  How do I go about stopping it?  I left her a message that it is normal to still have some drainage, that could last up to 2-3 weeks.  I advised her to continue to do soaks and dressings as instructed.  If there is excessive bleeding or if it's just pouring please give [place] a call to schedule an appointment to see Dr. Ralene Cork.

## 2014-01-23 ENCOUNTER — Other Ambulatory Visit: Payer: Self-pay | Admitting: Obstetrics and Gynecology

## 2014-01-23 DIAGNOSIS — Z1211 Encounter for screening for malignant neoplasm of colon: Secondary | ICD-10-CM

## 2014-02-14 ENCOUNTER — Ambulatory Visit (INDEPENDENT_AMBULATORY_CARE_PROVIDER_SITE_OTHER): Payer: 59

## 2014-02-14 VITALS — BP 130/78 | HR 74 | Resp 16

## 2014-02-14 DIAGNOSIS — L600 Ingrowing nail: Secondary | ICD-10-CM

## 2014-02-14 DIAGNOSIS — B351 Tinea unguium: Secondary | ICD-10-CM

## 2014-02-14 DIAGNOSIS — R52 Pain, unspecified: Secondary | ICD-10-CM

## 2014-02-14 DIAGNOSIS — Z09 Encounter for follow-up examination after completed treatment for conditions other than malignant neoplasm: Secondary | ICD-10-CM

## 2014-02-14 NOTE — Patient Instructions (Signed)

## 2014-02-14 NOTE — Progress Notes (Signed)
   Subjective:    Patient ID: Brandi Williams, female    DOB: 07-16-1956, 58 y.o.   MRN: [id number]  HPI  Pt is here for toenail removal recheck, right great toe, still drains, she has been doing daily soaks and covering with a bandage. She is afebrile and states that the pain is tolerable  Review of Systems no new findings or systemic changes noted     Objective:   Physical Exam Neurovascular status is intact pedal pulses palpable patient is doing well there is a good pink granular base to the nailbed no signs of spicules no regrowth no purulence no ascending cellulitis or lymphangitis no signs of infection maintain Neosporin and Band-Aid during the day later try at night maintain cleansing with soap and water as instructed       Assessment & Plan:  Assessment good postop progress status post AP nail procedure seems to be resolving well should be healed within the next 2-4 weeks followup if not resolve by the end of the month. Neosporin and Band-Aid applied at today's visit  Alvan Dame DPM

## 2014-04-25 DIAGNOSIS — M79609 Pain in unspecified limb: Secondary | ICD-10-CM

## 2014-06-06 ENCOUNTER — Telehealth: Payer: Self-pay | Admitting: *Deleted

## 2014-06-06 NOTE — Telephone Encounter (Signed)
I need to schedule to get surgery on my toes from Dr. Ralene Cork, to straighten my toes out.  I'm at work so you're going to have to leave a message.  Thank you.  I called and left her a message that she will need to come in for a consultation and then we will get her scheduled for the surgery.

## 2014-06-19 ENCOUNTER — Ambulatory Visit (INDEPENDENT_AMBULATORY_CARE_PROVIDER_SITE_OTHER): Payer: 59

## 2014-06-19 VITALS — BP 135/88 | HR 79 | Resp 15 | Ht 61.5 in | Wt 166.0 lb

## 2014-06-19 DIAGNOSIS — S93335S Other dislocation of left foot, sequela: Secondary | ICD-10-CM

## 2014-06-19 DIAGNOSIS — S93305S Unspecified dislocation of left foot, sequela: Secondary | ICD-10-CM

## 2014-06-19 DIAGNOSIS — M2040 Other hammer toe(s) (acquired), unspecified foot: Secondary | ICD-10-CM

## 2014-06-19 DIAGNOSIS — M898X7 Other specified disorders of bone, ankle and foot: Secondary | ICD-10-CM

## 2014-06-19 DIAGNOSIS — M2570 Osteophyte, unspecified joint: Secondary | ICD-10-CM

## 2014-06-19 DIAGNOSIS — M069 Rheumatoid arthritis, unspecified: Secondary | ICD-10-CM

## 2014-06-19 DIAGNOSIS — M79673 Pain in unspecified foot: Secondary | ICD-10-CM

## 2014-06-19 DIAGNOSIS — M19079 Primary osteoarthritis, unspecified ankle and foot: Secondary | ICD-10-CM

## 2014-06-19 NOTE — Patient Instructions (Signed)
Pre-Operative Instructions  Congratulations, you have decided to take an important step to improving your quality of life.  You can be assured that the doctors of [facility] will be with you every step of the way.  1. Plan to be at the surgery center/hospital at least 1 (one) hour prior to your scheduled time unless otherwise directed by the surgical center/hospital staff.  You must have a responsible adult accompany you, remain during the surgery and drive you home.  Make sure you have directions to the surgical center/hospital and know how to get there on time. 2. For hospital based surgery you will need to obtain a history and physical form from your family physician within 1 month prior to the date of surgery- we will give you a form for you primary physician.  3. We make every effort to accommodate the date you request for surgery.  There are however, times where surgery dates or times have to be moved.  We will contact you as soon as possible if a change in schedule is required.   4. No Aspirin/Ibuprofen for one week before surgery.  If you are on aspirin, any non-steroidal anti-inflammatory medications (Mobic, Aleve, Ibuprofen) you should stop taking it 7 days prior to your surgery.  You make take Tylenol  For pain prior to surgery.  5. Medications- If you are taking daily heart and blood pressure medications, seizure, reflux, allergy, asthma, anxiety, pain or diabetes medications, make sure the surgery center/hospital is aware before the day of surgery so they may notify you which medications to take or avoid the day of surgery. 6. No food or drink after midnight the night before surgery unless directed otherwise by surgical center/hospital staff. 7. No alcoholic beverages 24 hours prior to surgery.  No smoking 24 hours prior to or 24 hours after surgery. 8. Wear loose pants or shorts- loose enough to fit over bandages, boots, and casts. 9. No slip on shoes, sneakers are best. 10. Bring  your boot with you to the surgery center/hospital.  Also bring crutches or a walker if your physician has prescribed it for you.  If you do not have this equipment, it will be provided for you after surgery. 11. If you have not been contracted by the surgery center/hospital by the day before your surgery, call to confirm the date and time of your surgery. 12. Leave-time from work may vary depending on the type of surgery you have.  Appropriate arrangements should be made prior to surgery with your employer. 13. Prescriptions will be provided immediately following surgery by your doctor.  Have these filled as soon as possible after surgery and take the medication as directed. 14. Remove nail polish on the operative foot. 15. Wash the night before surgery.  The night before surgery wash the foot and leg well with the antibacterial soap provided and water paying special attention to beneath the toenails and in between the toes.  Rinse thoroughly with water and dry well with a towel.  Perform this wash unless told not to do so by your physician.  Enclosed: 1 Ice pack (please put in freezer the night before surgery)   1 Hibiclens skin cleaner   Pre-op Instructions  If you have any questions regarding the instructions, do not hesitate to call our office.  [place]: [address] [place], [place] [address] [phone]  [place]: [address] [place], [place] [address] [phone]  [place]: [address]  [place], [place] [address] [phone]  Dr. Nora Rooke   Tuchman DPM, Dr. Norman Regal DPM Dr. Vieva Brummitt DPM, Dr. M. Todd Hyatt DPM, Dr. Kathryn Egerton DPM 

## 2014-06-19 NOTE — Progress Notes (Signed)
   Subjective:    Patient ID: Brandi Williams, female    DOB: 08-28-1955, 58 y.o.   MRN: [id number]  HPI Comments: Pt complains of pain and contraction of B/L 2 - 5th toes  For 2 years.  Pt complains of pain and knot to left 1st MPJ 1 year or more.     Review of Systems  All other systems reviewed and are negative.      Objective:   Physical Exam 58 year old female percents at this time with some new issues patient last seen in July has had bilateral bunionectomies done with pin fixations both great toes has some recurrence of an exostosis of the first MTP joint left great toe joint. Slight recurrence of valgus however for the most part a large osteophyte is identified clinically and radiographically. Her other concern is patient does have rheumatoid arthritis and has rigid digital contractures of toes 2,3,4 and 5 of both feet with both transverse and sagittal plane deformities. Lower extremity objective findings as follows vascular status is intact with pedal pulses palpable DP +2 PT plus one over 4 bilateral capillary refill time 3 seconds all digits epicritic and proprioceptive sensations intact and symmetric bilateral there is normal plantar response DTRs not listed dermatologic the skin color pigment normal hair growth absent nail somewhat criptotic orthopedic biomechanical exam there is painful toes 234 and 5 bilateral there is also prominent exostosis of first MTP area left x-rays confirm the exostosis and rigid contractures of toes 234 and 5 of both feet with 8 subluxation or dislocation of the second MTP joint left foot. 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 At this time per patient request my recommendation surgery for correction of ostectomy first MTP joint left great toe joint to be carried out and also tenotomy capsulotomy second MTP joint with fixation on left foot as well as hammertoe repairs with K wire fixations second third fourth and fifth toes of both to be carried out surgery done outpatient basis under local anesthetic and IV sedation. We'll need to wear a surgical shoe bilateral shoe for at least 6 weeks postoperatively on both feet patient her status to the toes with  At this time consent form is reviewed and signed all questions asked by the patient are answered there no chondral indications and surgery scheduled at her convenience for follow-up I will be found for least 3 months postoperatively until complete healing is noted. The surgery is to repair the rigid contractures of toes reduce pain symptomology and reduced keratoses of the associated with the toes patient or stand or no guarantees applied with the surgery and per patient request my recommendation surgery scheduled at this time.next  Alvan Dame DPM

## 2014-06-25 DIAGNOSIS — M79673 Pain in unspecified foot: Secondary | ICD-10-CM

## 2014-07-09 ENCOUNTER — Telehealth: Payer: Self-pay | Admitting: *Deleted

## 2014-07-09 NOTE — Telephone Encounter (Signed)
Pt asked if her disability paperwork has been completed, must have before 07/11/2014 surgery.

## 2014-07-11 DIAGNOSIS — M2042 Other hammer toe(s) (acquired), left foot: Secondary | ICD-10-CM

## 2014-07-11 DIAGNOSIS — M25775 Osteophyte, left foot: Secondary | ICD-10-CM

## 2014-07-11 DIAGNOSIS — M2041 Other hammer toe(s) (acquired), right foot: Secondary | ICD-10-CM

## 2014-07-11 NOTE — Telephone Encounter (Signed)
Pt daughter, picked up paperwork today, she completed it in the car and paperwork was faxed

## 2014-07-17 ENCOUNTER — Ambulatory Visit (INDEPENDENT_AMBULATORY_CARE_PROVIDER_SITE_OTHER): Payer: 59

## 2014-07-17 VITALS — BP 153/100 | HR 79 | Resp 12

## 2014-07-17 DIAGNOSIS — R52 Pain, unspecified: Secondary | ICD-10-CM

## 2014-07-17 NOTE — Progress Notes (Signed)
   Subjective:    Patient ID: Brandi Williams, female    DOB: [birth year], 57 y.o.   MRN: [id number]  HPI  DOS 12.2.15 HAMMERTOES 2,3,4,5. ''B/L TOE ARE DOING OK BUT HAVING THROBING PAIN.''  Review of Systems no new findings or systemic changes noted     Objective:   Physical Exam Neurovascular status is intact patient is 6 day status post hammertoe repair to 34 and 5 as well as tenotomy capsulotomy second MTP area left and ostectomy first metatarsal left. Patient is doing well incisions clean dry well coapted dressings were intact and dry patient ambulating in bilateral arc oh shoes as instructed. X-rays taken at this time reveal good position the osteotomy and intact K wire fixations and all toes she's had previous bunion surgeries which also have intact or retained fixation. Second third and fourth digits have retained K wire fixation second left extends beyond the MTP joint for correction of dislocated second MTP area. Mild edema mild ecchymosis consistent with postop course incisions clean dry well coapted. Minimal pain or discomfort noted again vascular status is intact mild ecchymosis noted there still some dorsal lateral deviation of lesser digits right more so than left       Assessment & Plan:  Assessment good postop progress 5 multiple hammertoe repairs as well as relocations dislocate second MTP joint and ostectomy first metatarsal left plan at this time presto compressive dressings are reapplied to both feet patient will reappoint one week plan for suture removal at that time. Maintain surgical shoe at all times patient asked about using a wheelchair to go shopping this is certainly allowable patient use a wheelchair power wheelchair she can borrow rent for moving around her activities or to be able go shopping at the mall or store she does have handicap parking excesses well temporarily which is provided. Since incisions again well coapted reappoint one week plan for suture removal in 4  weeks from now or 3 weeks after next visit will likely Follow-up x-ray and K wire removal when ready patient in minimal pain Tylenol or Advil as needed for pain at this point keep elevated and ice whenever possible.    Alvan Dame DPM

## 2014-07-17 NOTE — Patient Instructions (Signed)
ICE INSTRUCTIONS  Apply ice or cold pack to the affected area at least 3 times a day for 10-15 minutes each time.  You should also use ice after prolonged activity or vigorous exercise.  Do not apply ice longer than 20 minutes at one time.  Always keep a cloth between your skin and the ice pack to prevent burns.  Being consistent and following these instructions will help control your symptoms.  We suggest you purchase a gel ice pack because they are reusable and do bit leak.  Some of them are designed to wrap around the area.  Use the method that works best for you.  Here are some other suggestions for icing.   Use a frozen bag of peas or corn-inexpensive and molds well to your body, usually stays frozen for 10 to 20 minutes.  Wet a towel with cold water and squeeze out the excess until it's damp.  Place in a bag in the freezer for 20 minutes. Then remove and use.  Maintain surgical shoes at all times. May increase the amount of time on her feet by 5-10 minutes per week for example can be of 5 minutes per hour the first week, 10 minutes per hour the second week, 15 minutes per hour the third week and so on

## 2014-07-24 ENCOUNTER — Ambulatory Visit (INDEPENDENT_AMBULATORY_CARE_PROVIDER_SITE_OTHER): Payer: 59

## 2014-07-24 VITALS — BP 145/88 | HR 76 | Resp 18

## 2014-07-24 DIAGNOSIS — M19079 Primary osteoarthritis, unspecified ankle and foot: Secondary | ICD-10-CM

## 2014-07-24 DIAGNOSIS — M898X7 Other specified disorders of bone, ankle and foot: Secondary | ICD-10-CM

## 2014-07-24 DIAGNOSIS — M2570 Osteophyte, unspecified joint: Secondary | ICD-10-CM

## 2014-07-24 DIAGNOSIS — M2040 Other hammer toe(s) (acquired), unspecified foot: Secondary | ICD-10-CM

## 2014-07-24 DIAGNOSIS — Z09 Encounter for follow-up examination after completed treatment for conditions other than malignant neoplasm: Secondary | ICD-10-CM

## 2014-07-24 DIAGNOSIS — S93335S Other dislocation of left foot, sequela: Secondary | ICD-10-CM

## 2014-07-24 NOTE — Patient Instructions (Addendum)
ICE INSTRUCTIONS  Apply ice or cold pack to the affected area at least 3 times a day for 10-15 minutes each time.  You should also use ice after prolonged activity or vigorous exercise.  Do not apply ice longer than 20 minutes at one time.  Always keep a cloth between your skin and the ice pack to prevent burns.  Being consistent and following these instructions will help control your symptoms.  We suggest you purchase a gel ice pack because they are reusable and do bit leak.  Some of them are designed to wrap around the area.  Use the method that works best for you.  Here are some other suggestions for icing.   Use a frozen bag of peas or corn-inexpensive and molds well to your body, usually stays frozen for 10 to 20 minutes.  Wet a towel with cold water and squeeze out the excess until it's damp.  Place in a bag in the freezer for 20 minutes. Then remove and use.   May resume normal bathing or hygiene wash feet quick bath or shower 10:15 minutes dry well apply Neosporin or Polysporin to the incisions and pin sites and rewrap the toe with Coflex and maintain compression stockings and use of surgical shoes for another 3-4 weeks until next follow-up x-ray

## 2014-07-24 NOTE — Progress Notes (Signed)
   Subjective:    Patient ID: Brandi Williams, female    DOB: 12/09/1955, 58 y.o.   MRN: [id number]  HPI I HAD SURGERY ON 07/11/14 AND MY FEET ARE DOING OK  AND I HAVE HAD NO PAIN MEDICINE FOR ABOUT A WEEK NOW    Review of Systems no new findings or systemic changes noted     Objective:   Physical Exam  Neurovascular status is intact and unchanged patient is just under 2 weeks status post multiple hammertoe repairs 234 and 5 exostectomy first left and the digital fusions. K wires are intact sutures removed at this time minimal pain or discomfort is noted wounds are well coapted Coflex wrapping and a dry sterile dressing applied to the forefoot will maintain Coflex wrapping of toes and compression stockings are also applied and will be maintained for the next 3-4 weeks. Incisions clean dry no signs of infection no discharge or drainage no dehiscence noted      Assessment & Plan:  Assessment good postop progress 2 weeks status post multiple hammertoe procedures as well as exostectomy and and relocation MTP joint. This time pins were maintained for another 3-4 weeks follow-up in 3-4 weeks for further x-ray and reevaluation and likely pin removal maintain compression stocking maintain Coflex wrapping as instructed recommend Tylenol or Advil as needed for pain also ice for any swelling or throbbing is needed  Alvan Dame DPM

## 2014-07-30 ENCOUNTER — Telehealth: Payer: Self-pay | Admitting: *Deleted

## 2014-07-30 NOTE — Telephone Encounter (Signed)
Patient may be offered a prescription for hydrocodone apap 5/325 mg, dispensed #40 with no refills, one every 6 hours when necessary pain. Patient will have to pick up to prescription at the [facility] office will need to have somebody fill out and printed out the prescription and signed prescription for patient to pick up in [facility]  Thanks,  Alvan Dame DPM

## 2014-07-30 NOTE — Telephone Encounter (Addendum)
Pt request refill of Oxycodone.  Dr. Dionne Bucy orders are called to pt and she will pick up the medication in the [place] office.

## 2014-07-31 MED ORDER — HYDROCODONE-ACETAMINOPHEN 5-325 MG PO TABS: 1.0000 | ORAL_TABLET | Freq: Four times a day (QID) | ORAL | Status: DC | PRN

## 2014-08-06 NOTE — Telephone Encounter (Signed)
Per Dr Ralene Cork can have hydrocodone, just needs to be picked up in [place] office. Thanks lisa

## 2014-08-14 ENCOUNTER — Telehealth: Payer: Self-pay | Admitting: *Deleted

## 2014-08-14 NOTE — Telephone Encounter (Signed)
Narcotic pain medications cannot be called in she will have to come in to pick up a prescription at the office. However recommendation at this time is to use ibuprofen or Advil or Aleve over-the-counter 2 or 3 times daily, or plain Tylenol as needed for pain. Also elevated and ice pack. If she continues to request pain medication will need to have patient come in to pick up a prescription  Brandi Williams DPM

## 2014-08-14 NOTE — Telephone Encounter (Addendum)
Pt request refill of her pain medication.  Dr. Ralene Cork ordered refill Vicodin as previously, and call instructions to pt as ordered on 08/14/2014.  Orders to pt.

## 2014-08-15 MED ORDER — HYDROCODONE-ACETAMINOPHEN 5-325 MG PO TABS: 1.0000 | ORAL_TABLET | Freq: Four times a day (QID) | ORAL | Status: DC | PRN

## 2014-08-20 NOTE — Telephone Encounter (Signed)
I called patient to make sure that patient was called about her medicine. Brandi Williams

## 2014-08-21 ENCOUNTER — Ambulatory Visit (INDEPENDENT_AMBULATORY_CARE_PROVIDER_SITE_OTHER): Payer: 59

## 2014-08-21 VITALS — BP 149/94 | HR 77 | Resp 18

## 2014-08-21 DIAGNOSIS — M2040 Other hammer toe(s) (acquired), unspecified foot: Secondary | ICD-10-CM

## 2014-08-21 DIAGNOSIS — Z09 Encounter for follow-up examination after completed treatment for conditions other than malignant neoplasm: Secondary | ICD-10-CM

## 2014-08-21 DIAGNOSIS — S93305S Unspecified dislocation of left foot, sequela: Secondary | ICD-10-CM

## 2014-08-21 DIAGNOSIS — S93335S Other dislocation of left foot, sequela: Secondary | ICD-10-CM

## 2014-08-21 NOTE — Progress Notes (Signed)
   Subjective:    Patient ID: Brandi Williams, female    DOB: September 29, 1955, 59 y.o.   MRN: [id number]  HPI i am ready for the pins to come out on both of my feet and i am doing ok    Review of Systems no new findings or systemic changes noted     Objective:   Physical Exam Neurovascular status is unchanged patient has not been washing her foot had a hard time putting the compression stockings on since she didn't use them for the most part. She left the Coflex wrapping and place this time the incisions. We will coapted K wires replacer some dried skin and eschar tissue present resolve with resume bathing and normal hygiene starting tomorrow. At this time x-rays revealed good positioning osteotomies intact K wire fixations second and third wires are third wires bilateral somewhat withdrawn however for the most part doing well slight bend in the second wire left foot at the MTP joint noted as well. Her vascular status is intact pedal pulses are palpable no open wounds no dehiscence no discharge drainage no signs of infection.       Assessment & Plan:  Assessment good postop progress K wires removed times all 6 toes following removal K wires Neosporin and Band-Aid dressings applied and Coflex wrapping buddy wrapping toes 23 and 4 on both feet. Additional Coflex dispensed for patient to maintain buddy wrapping maintain anklet as needed return in one month for long-term postop follow-up. She scheduled to return to work duties within 2 weeks should be able to keep that appointment. However if it continues to have difficulties may requested extension. However at this time plan is for her to return in 2 weeks which will be 8 weeks postop to regular work activities. May still have some swelling some achiness and soreness maintain Coflex wrapping first another 2 or 3 weeks  Alvan Dame DPM

## 2014-08-21 NOTE — Patient Instructions (Signed)
ANTIBACTERIAL SOAP INSTRUCTIONS  THE DAY AFTER PROCEDURE  Please follow the instructions your doctor has marked.   Shower as usual. Before getting out, place a drop of antibacterial liquid soap (Dial) on a wet, clean washcloth.  Gently wipe washcloth over affected area.  Afterward, rinse the area with warm water.  Blot the area dry with a soft cloth and cover with antibiotic ointment (neosporin, polysporin, bacitracin) and band aid or gauze and tape  Place 3-4 drops of antibacterial liquid soap in a quart of warm tap water.  Submerge foot into water for 20 minutes.  If bandage was applied after your procedure, leave on to allow for easy lift off, then remove and continue with soak for the remaining time.  Next, blot area dry with a soft cloth and cover with a bandage.  Apply other medications as directed by your doctor, such as cortisporin otic solution (eardrops) or neosporin antibiotic ointment  Wash the feet daily with antibacterial soap and warm water after washing apply Coflex wrapping daily to not overtighten maintain buddy wrapping of toes as instructed

## 2014-08-23 ENCOUNTER — Telehealth: Payer: Self-pay | Admitting: *Deleted

## 2014-08-23 NOTE — Telephone Encounter (Signed)
"  I think I would like to stay out of work a little bit longer.  I need you to tell Dr. Ralene Cork.  I will get the paper from work and have them fax it to you.  Thank you.  Give me a call back."

## 2014-08-23 NOTE — Telephone Encounter (Signed)
Okay to extend her an additional 4 weeks of work leave at this time. Next  Alvan Dame DPM

## 2014-09-11 ENCOUNTER — Telehealth: Payer: Self-pay

## 2014-09-11 NOTE — Telephone Encounter (Signed)
Patient called stating that the company did not get her completed paperwork we faxed. She gave me the fax number [phone] to re-fax it to. Attention [employer].

## 2014-09-14 DIAGNOSIS — M2040 Other hammer toe(s) (acquired), unspecified foot: Secondary | ICD-10-CM

## 2014-09-18 ENCOUNTER — Ambulatory Visit: Payer: 59

## 2014-09-25 ENCOUNTER — Telehealth: Payer: Self-pay | Admitting: *Deleted

## 2014-09-25 ENCOUNTER — Ambulatory Visit: Payer: 59

## 2014-09-25 NOTE — Telephone Encounter (Signed)
Patient was advised she should be able to return to work at 8 weeks postop that was 2 weeks after her last visit provide her notes her paperwork necessary for her to return to work at this time.  Alvan Dame DPM

## 2014-09-25 NOTE — Telephone Encounter (Signed)
Pt states she needs office notes faxed to her boss releasing her and her restrictions.  Please call.

## 2014-10-09 ENCOUNTER — Ambulatory Visit (INDEPENDENT_AMBULATORY_CARE_PROVIDER_SITE_OTHER): Payer: 59

## 2014-10-09 ENCOUNTER — Ambulatory Visit: Payer: 59

## 2014-10-09 VITALS — BP 168/99 | HR 84 | Resp 12

## 2014-10-09 VITALS — BP 168/68 | HR 72 | Resp 12

## 2014-10-09 DIAGNOSIS — M2042 Other hammer toe(s) (acquired), left foot: Secondary | ICD-10-CM

## 2014-10-09 DIAGNOSIS — Z09 Encounter for follow-up examination after completed treatment for conditions other than malignant neoplasm: Secondary | ICD-10-CM

## 2014-10-09 DIAGNOSIS — M2041 Other hammer toe(s) (acquired), right foot: Secondary | ICD-10-CM

## 2014-10-09 DIAGNOSIS — M2040 Other hammer toe(s) (acquired), unspecified foot: Secondary | ICD-10-CM

## 2014-10-09 DIAGNOSIS — M069 Rheumatoid arthritis, unspecified: Secondary | ICD-10-CM

## 2014-10-09 NOTE — Progress Notes (Signed)
   Subjective:    Patient ID: Brandi Williams, female    DOB: 1956/03/25, 59 y.o.   MRN: [id number]  HPI  DOS 12.2.15 HAMMERTOES 2,3,4,5.''B/L TOES ARE LITTLE SORE.''  Review of Systems No new findings or systemic changes noted. Patient does have history of rheumatoid arthropathy continues to have some contractures of toes she been trying to bend her toes advised her not to do that.    Objective:   Physical Exam Objective findings vascular status intact and unchanged pedal pulses are palpable DP +2 PT plus one over 4 the toes are examined. Both second toes. Rectus third and fourth toes are deviating third toe right foot daily laterally and the fourth toe left foot is under lapping the third with some contractures being present x-rays confirm at resection of proximal phalanx and bones for correction of hammertoe mallet toe type deformities. Her rheumatoid arthropathy likely continue with pain discomfort swelling of the joints both of her hands and feet. At this time patient advised to she shouldn't return to work duties without a major restrictions this next week. Is also will maintain a good walking tennis or athletic shoe. Avoid trying to bend the toes at the IP joints.       Assessment & Plan:  Assessment good postop progress patient is advised and demonstrated to maintain Coflex wrapping her buddy wrapping of toes 23 and 4 at which is applied and will be maintained by the patient at this time. Return to work activities this coming Monday without restrictions. Return in 2 months for long-term postop follow-up and x-rays maintain stable shoe wear at all times  Alvan Dame DPM

## 2014-10-09 NOTE — Progress Notes (Signed)
   Subjective:    Patient ID: Brandi Williams, female    DOB: October 07, 1955, 59 y.o.   MRN: [id number]  HPI  DOS 12.2.15 HAMMERTOES 2,3,4,5. ''B/L TOES ARE A LITTLE SORE.''  Review of Systems     Objective:   Physical Exam        Assessment & Plan:

## 2014-10-09 NOTE — Patient Instructions (Signed)
ICE INSTRUCTIONS  Apply ice or cold pack to the affected area at least 3 times a day for 10-15 minutes each time.  You should also use ice after prolonged activity or vigorous exercise.  Do not apply ice longer than 20 minutes at one time.  Always keep a cloth between your skin and the ice pack to prevent burns.  Being consistent and following these instructions will help control your symptoms.  We suggest you purchase a gel ice pack because they are reusable and do bit leak.  Some of them are designed to wrap around the area.  Use the method that works best for you.  Here are some other suggestions for icing.   Use a frozen bag of peas or corn-inexpensive and molds well to your body, usually stays frozen for 10 to 20 minutes.  Wet a towel with cold water and squeeze out the excess until it's damp.  Place in a bag in the freezer for 20 minutes. Then remove and use.   Maintain Coflex buddy wrapping of toes 23 and 4 to maintain toe position in a straight alignment.

## 2014-12-25 ENCOUNTER — Ambulatory Visit (INDEPENDENT_AMBULATORY_CARE_PROVIDER_SITE_OTHER): Payer: 59 | Admitting: Podiatry

## 2014-12-25 ENCOUNTER — Encounter: Payer: Self-pay | Admitting: Podiatry

## 2014-12-25 ENCOUNTER — Ambulatory Visit (INDEPENDENT_AMBULATORY_CARE_PROVIDER_SITE_OTHER): Payer: 59

## 2014-12-25 VITALS — BP 141/86 | HR 74 | Resp 16

## 2014-12-25 DIAGNOSIS — M2040 Other hammer toe(s) (acquired), unspecified foot: Secondary | ICD-10-CM

## 2014-12-25 DIAGNOSIS — Z9889 Other specified postprocedural states: Secondary | ICD-10-CM

## 2014-12-25 NOTE — Progress Notes (Signed)
She presents today as a rheumatoid patient status post hammertoe repair toes #2 #3 #4 #5 bilaterally. She states that the second digit of the right foot is still somewhat painful but appears to be progressively getting well.  Objective: Vital signs are stable she is alert and oriented 3. Hammertoe repair is less than optimal however this is more than likely secondary to rheumatoid arthritis.  Assessment: Well-healing rheumatoid hammertoe repair.  Plan: Follow up with [place] on an as-needed basis.

## 2015-04-04 ENCOUNTER — Emergency Department (HOSPITAL_BASED_OUTPATIENT_CLINIC_OR_DEPARTMENT_OTHER)
Admission: EM | Admit: 2015-04-04 | Discharge: 2015-04-04 | Disposition: A | Discharge status: Home / Self Care | Payer: 59 | Attending: Emergency Medicine | Admitting: Emergency Medicine

## 2015-04-04 ENCOUNTER — Encounter (HOSPITAL_BASED_OUTPATIENT_CLINIC_OR_DEPARTMENT_OTHER): Payer: Self-pay | Admitting: *Deleted

## 2015-04-04 DIAGNOSIS — H409 Unspecified glaucoma: Secondary | ICD-10-CM | POA: Diagnosis not present

## 2015-04-04 DIAGNOSIS — R030 Elevated blood-pressure reading, without diagnosis of hypertension: Secondary | ICD-10-CM | POA: Insufficient documentation

## 2015-04-04 DIAGNOSIS — N390 Urinary tract infection, site not specified: Secondary | ICD-10-CM | POA: Diagnosis not present

## 2015-04-04 DIAGNOSIS — R197 Diarrhea, unspecified: Secondary | ICD-10-CM | POA: Diagnosis present

## 2015-04-04 DIAGNOSIS — Z791 Long term (current) use of non-steroidal anti-inflammatories (NSAID): Secondary | ICD-10-CM | POA: Insufficient documentation

## 2015-04-04 DIAGNOSIS — Z87891 Personal history of nicotine dependence: Secondary | ICD-10-CM | POA: Diagnosis not present

## 2015-04-04 DIAGNOSIS — IMO0001 Reserved for inherently not codable concepts without codable children: Secondary | ICD-10-CM

## 2015-04-04 DIAGNOSIS — Z8739 Personal history of other diseases of the musculoskeletal system and connective tissue: Secondary | ICD-10-CM | POA: Diagnosis not present

## 2015-04-04 LAB — URINALYSIS, ROUTINE W REFLEX MICROSCOPIC
Bilirubin Urine: NEGATIVE
GLUCOSE, UA: NEGATIVE mg/dL
KETONES UR: NEGATIVE mg/dL
NITRITE: NEGATIVE
PH: 6 (ref 5.0–8.0)
Protein, ur: NEGATIVE mg/dL
SPECIFIC GRAVITY, URINE: 1.005 (ref 1.005–1.030)
Urobilinogen, UA: 0.2 mg/dL (ref 0.0–1.0)

## 2015-04-04 LAB — COMPREHENSIVE METABOLIC PANEL
ALBUMIN: 3.9 g/dL (ref 3.5–5.0)
ALK PHOS: 67 U/L (ref 38–126)
ALT: 18 U/L (ref 14–54)
ANION GAP: 10 (ref 5–15)
AST: 23 U/L (ref 15–41)
BILIRUBIN TOTAL: 0.7 mg/dL (ref 0.3–1.2)
BUN: 7 mg/dL (ref 6–20)
CALCIUM: 9 mg/dL (ref 8.9–10.3)
CO2: 26 mmol/L (ref 22–32)
Chloride: 104 mmol/L (ref 101–111)
Creatinine, Ser: 0.69 mg/dL (ref 0.44–1.00)
GFR calc Af Amer: >60 mL/min (ref >60)
GLUCOSE: 99 mg/dL (ref 65–99)
POTASSIUM: 3.3 mmol/L — AB (ref 3.5–5.1)
Sodium: 140 mmol/L (ref 135–145)
TOTAL PROTEIN: 7 g/dL (ref 6.5–8.1)

## 2015-04-04 LAB — CBC WITH DIFFERENTIAL/PLATELET
Basophils Absolute: 0 10*3/uL (ref 0.0–0.1)
Basophils Relative: 1 % (ref 0–1)
Eosinophils Absolute: 0.1 10*3/uL (ref 0.0–0.7)
Eosinophils Relative: 1 % (ref 0–5)
HEMATOCRIT: 38.7 % (ref 36.0–46.0)
HEMOGLOBIN: 13.2 g/dL (ref 12.0–15.0)
LYMPHS ABS: 1 10*3/uL (ref 0.7–4.0)
LYMPHS PCT: 15 % (ref 12–46)
MCH: 30.6 pg (ref 26.0–34.0)
MCHC: 34.1 g/dL (ref 30.0–36.0)
MCV: 89.8 fL (ref 78.0–100.0)
MONO ABS: 0.5 10*3/uL (ref 0.1–1.0)
MONOS PCT: 8 % (ref 3–12)
NEUTROS ABS: 4.9 10*3/uL (ref 1.7–7.7)
NEUTROS PCT: 75 % (ref 43–77)
Platelets: 222 10*3/uL (ref 150–400)
RBC: 4.31 MIL/uL (ref 3.87–5.11)
RDW: 12.8 % (ref 11.5–15.5)
WBC: 6.4 10*3/uL (ref 4.0–10.5)

## 2015-04-04 LAB — URINE MICROSCOPIC-ADD ON

## 2015-04-04 LAB — LIPASE, BLOOD: LIPASE: 19 U/L — AB (ref 22–51)

## 2015-04-04 MED ORDER — KETOROLAC TROMETHAMINE 30 MG/ML IJ SOLN
15.0000 mg | Freq: Once | INTRAMUSCULAR | Status: AC
  Administered 2015-04-04: 15 mg via INTRAVENOUS
  Filled 2015-04-04: qty 1

## 2015-04-04 MED ORDER — SODIUM CHLORIDE 0.9 % IV BOLUS (SEPSIS)
500.0000 mL | Freq: Once | INTRAVENOUS | Status: AC
  Administered 2015-04-04: 500 mL via INTRAVENOUS

## 2015-04-04 MED ORDER — CEPHALEXIN 500 MG PO CAPS: 500.0000 mg | ORAL_CAPSULE | Freq: Two times a day (BID) | ORAL | Status: AC

## 2015-04-04 MED ORDER — CEPHALEXIN 250 MG PO CAPS: 500.0000 mg | ORAL_CAPSULE | Freq: Once | ORAL | Status: DC

## 2015-04-04 MED ORDER — CEPHALEXIN 250 MG PO CAPS
500.0000 mg | ORAL_CAPSULE | Freq: Once | ORAL | Status: AC
  Administered 2015-04-04: 500 mg via ORAL
  Filled 2015-04-04: qty 2

## 2015-04-04 MED ORDER — POTASSIUM CHLORIDE CRYS ER 20 MEQ PO TBCR
40.0000 meq | EXTENDED_RELEASE_TABLET | Freq: Once | ORAL | Status: AC
  Administered 2015-04-04: 40 meq via ORAL
  Filled 2015-04-04: qty 2

## 2015-04-04 NOTE — ED Notes (Signed)
Diarrhea x 3 days. Nausea.

## 2015-04-04 NOTE — ED Provider Notes (Signed)
CSN: [id number]     Arrival date & time 04/04/15  1637 History   First MD Initiated Contact with Patient 04/04/15 1646     Chief Complaint  Patient presents with  . Diarrhea     (Consider location/radiation/quality/duration/timing/severity/associated sxs/prior Treatment) HPI  Blood pressure 180/98, pulse 92, temperature 98.1 F (36.7 C), temperature source Oral, resp. rate 20, height 5' 1.5" (1.562 m), weight 166 lb (75.297 kg), SpO2 99 %.  Brandi Williams is a 59 y.o. female complaining of diarrhea onset 3 days ago associated with nausea, slight global headache and cramping just before she has a bowel movement. She had 6-7 episodes of diarrhea today. Patient denies emesis, focal abdominal pain, melena, hematochezia, sick contacts, recent travel, fever, chills, change in urination, antibiotics use in the last 2 months. No medication taken prior to arrival. Does not have a diagnosis of hypertension.  Past Medical History  Diagnosis Date  . Glaucoma (increased eye pressure)   . Allergy   . Arthritis    Past Surgical History  Procedure Laterality Date  . Oophorectomy    . Abdominal hysterectomy      DUB   Family History  Problem Relation Age of Onset  . Heart failure Mother   . Rheum arthritis Mother   . Heart failure Father   . COPD Father   . Cancer Sister     Lung  . Cancer Brother     colon   Social History  Substance Use Topics  . Smoking status: Former Smoker -- 1.00 packs/day    Types: Cigarettes  . Smokeless tobacco: None  . Alcohol Use: 0.0 oz/week    0 Standard drinks or equivalent per week     Comment: SOCIALLY   OB History    No data available     Review of Systems  10 systems reviewed and found to be negative, except as noted in the HPI.  Allergies  Review of patient's allergies indicates no known allergies.  Home Medications   Prior to Admission medications   Medication Sig Start Date End Date Taking? Authorizing Provider  Calcium Carbonate  (CALCIUM 600 PO) Take by mouth.    Historical Provider, MD  cephALEXin (KEFLEX) 500 MG capsule Take 1 capsule (500 mg total) by mouth 2 (two) times daily. 04/04/15   Torri Michalski, PA-C  chlorpheniramine-HYDROcodone (TUSSIONEX) 10-8 MG/5ML [facility]  08/30/13   Historical Provider, MD  CINNAMON PO Take 1,000 mg by mouth.    Historical Provider, MD  desloratadine (CLARINEX) 5 MG tablet  11/05/13   Historical Provider, MD  folic acid (FOLVITE) 1 MG tablet  02/12/14   Historical Provider, MD  ibuprofen (ADVIL,MOTRIN) 100 MG tablet Take 100 mg by mouth every 6 (six) hours as needed for fever. Not sure of the mg/lc    Historical Provider, MD  latanoprost (XALATAN) 0.005 % ophthalmic solution  10/04/13   Historical Provider, MD  meloxicam (MOBIC) 15 MG tablet Take 1 tablet (15 mg total) by mouth daily. 11/20/13   Alvan Dame, DPM  methotrexate (RHEUMATREX) 2.5 MG tablet  02/12/14   Historical Provider, MD  Multiple Vitamin (MULTIVITAMIN) tablet Take 1 tablet by mouth daily. Vit b12 and vit d3 and cinnamon and doctors slect weight loss true heart and calcium and fish oil and probotic and power C    Historical Provider, MD  oxyCODONE-acetaminophen (PERCOCET/ROXICET) 5-325 MG per tablet Take 1-2 tablets by mouth every 4 (four) hours as needed for severe pain. 07/11/14   Alvan Dame, DPM  Phenyleph-CPM-DM-APAP (ALKA-SELTZER PLUS COLD & COUGH) 12-09-08-325 MG CAPS Take by mouth.      Historical Provider, MD  predniSONE (DELTASONE) 20 MG tablet 2 daily x 4 days, then 1 daily x 4 days 05/21/12   Ethelda Chick, MD  triamcinolone (KENALOG) 0.1 % paste  11/29/13   Historical Provider, MD   BP 152/83 mmHg  Pulse 82  Temp(Src) 98.1 F (36.7 C) (Oral)  Resp 16  Ht 5' 1.5" (1.562 m)  Wt 166 lb (75.297 kg)  BMI 30.86 kg/m2  SpO2 98% Physical Exam  Constitutional: She is oriented to person, place, and time. She appears well-developed and well-nourished. No distress.  HENT:  Head: Normocephalic and atraumatic.   Slightly dry mucous membranes  Eyes: Conjunctivae and EOM are normal. Pupils are equal, round, and reactive to light.  Neck: Normal range of motion.  Cardiovascular: Normal rate, regular rhythm and intact distal pulses.   Pulmonary/Chest: Effort normal and breath sounds normal.  Abdominal: Soft. There is no tenderness.  Musculoskeletal: Normal range of motion.  Ulnar deviation on hands and feet.  Neurological: She is alert and oriented to person, place, and time.  Skin: She is not diaphoretic.  Psychiatric: She has a normal mood and affect.  Nursing note and vitals reviewed.   ED Course  Procedures (including critical care time) Labs Review Labs Reviewed  URINALYSIS, ROUTINE W REFLEX MICROSCOPIC (NOT AT [facility]) - Abnormal; Notable for the following:    APPearance CLOUDY (*)    Hgb urine dipstick SMALL (*)    Leukocytes, UA LARGE (*)    All other components within normal limits  COMPREHENSIVE METABOLIC PANEL - Abnormal; Notable for the following:    Potassium 3.3 (*)    All other components within normal limits  LIPASE, BLOOD - Abnormal; Notable for the following:    Lipase 19 (*)    All other components within normal limits  URINE MICROSCOPIC-ADD ON - Abnormal; Notable for the following:    Bacteria, UA MANY (*)    All other components within normal limits  URINE CULTURE  CBC WITH DIFFERENTIAL/PLATELET  GI PATHOGEN PANEL BY PCR, STOOL    Imaging Review No results found. I have personally reviewed and evaluated these images and lab results as part of my medical decision-making.   EKG Interpretation None      MDM   Final diagnoses:  Diarrhea  UTI (lower urinary tract infection)  Elevated blood pressure    Filed Vitals:   04/04/15 1644 04/04/15 1824  BP: 180/98 152/83  Pulse: 92 82  Temp: 98.1 F (36.7 C)   TempSrc: Oral   Resp: 20 16  Height: 5' 1.5" (1.562 m)   Weight: 166 lb (75.297 kg)   SpO2: 99% 98%    Medications  cephALEXin (KEFLEX) capsule 500  mg (500 mg Oral Given 04/04/15 1852)  potassium chloride SA (K-DUR,KLOR-CON) CR tablet 40 mEq (40 mEq Oral Given 04/04/15 1836)  ketorolac (TORADOL) 30 MG/ML injection 15 mg (15 mg Intravenous Given 04/04/15 1836)  sodium chloride 0.9 % bolus 500 mL (0 mLs Intravenous Stopped 04/04/15 1909)    Brandi Williams is a pleasant 59 y.o. female presenting with diarrhea, abdominal exam is benign. Think headache is likely related to slight dehydration. Check basic blood work, bolus and given Zofran.  Urinalysis shows large leukocytes many bacteria and 21-50 white blood cells. Urine culture is ordered. She reports that she had a UTI 4 months ago, she's taking methotrexate for her rheumatoid arthritis and  states that she does get these frequently. Patient is asymptomatic but will start on antibiotics considering her essential immunosuppression with methotrexate.  Patients cannot produce a stool sample for analysis. Advised her to take Imodium and follow closely with her primary care physician.  Evaluation does not show pathology that would require ongoing emergent intervention or inpatient treatment. Pt is hemodynamically stable and mentating appropriately. Discussed findings and plan with patient/guardian, who agrees with care plan. All questions answered. Return precautions discussed and outpatient follow up given.   Discharge Medication List as of 04/04/2015  6:29 PM    START taking these medications   Details  cephALEXin (KEFLEX) 500 MG capsule Take 1 capsule (500 mg total) by mouth 2 (two) times daily., Starting 04/04/2015, Until Discontinued, Print             Wynetta Emery, PA-C 04/04/15 2140  Tilden Fossa, MD 04/04/15 2300

## 2015-04-04 NOTE — Discharge Instructions (Signed)
Take Imodium and stay well hydrated. Please follow with your primary care doctor in the next 2-3 days for a checkup. Do not hesitate to return to the emergency room for any new, worsening or concerning symptoms. Diarrhea Diarrhea is frequent loose and watery bowel movements. It can cause you to feel weak and dehydrated. Dehydration can cause you to become tired and thirsty, have a dry mouth, and have decreased urination that often is dark yellow. Diarrhea is a sign of another problem, most often an infection that will not last long. In most cases, diarrhea typically lasts 2-3 days. However, it can last longer if it is a sign of something more serious. It is important to treat your diarrhea as directed by your caregiver to lessen or prevent future episodes of diarrhea. CAUSES  Some common causes include:  Gastrointestinal infections caused by viruses, bacteria, or parasites.  Food poisoning or food allergies.  Certain medicines, such as antibiotics, chemotherapy, and laxatives.  Artificial sweeteners and fructose.  Digestive disorders. HOME CARE INSTRUCTIONS  Ensure adequate fluid intake (hydration): Have 1 cup (8 oz) of fluid for each diarrhea episode. Avoid fluids that contain simple sugars or sports drinks, fruit juices, whole milk products, and sodas. Your urine should be clear or pale yellow if you are drinking enough fluids. Hydrate with an oral rehydration solution that you can purchase at pharmacies, retail stores, and online. You can prepare an oral rehydration solution at home by mixing the following ingredients together:   - tsp table salt.   tsp baking soda.   tsp salt substitute containing potassium chloride.  1  tablespoons sugar.  1 L (34 oz) of water.  Certain foods and beverages may increase the speed at which food moves through the gastrointestinal (GI) tract. These foods and beverages should be avoided and include:  Caffeinated and alcoholic beverages.  High-fiber  foods, such as raw fruits and vegetables, nuts, seeds, and whole grain breads and cereals.  Foods and beverages sweetened with sugar alcohols, such as xylitol, sorbitol, and mannitol.  Some foods may be well tolerated and may help thicken stool including:  Starchy foods, such as rice, toast, pasta, low-sugar cereal, oatmeal, grits, baked potatoes, crackers, and bagels.  Bananas.  Applesauce.  Add probiotic-rich foods to help increase healthy bacteria in the GI tract, such as yogurt and fermented milk products.  Wash your hands well after each diarrhea episode.  Only take over-the-counter or prescription medicines as directed by your caregiver.  Take a warm bath to relieve any burning or pain from frequent diarrhea episodes. SEEK IMMEDIATE MEDICAL CARE IF:   You are unable to keep fluids down.  You have persistent vomiting.  You have blood in your stool, or your stools are black and tarry.  You do not urinate in 6-8 hours, or there is only a small amount of very dark urine.  You have abdominal pain that increases or localizes.  You have weakness, dizziness, confusion, or light-headedness.  You have a severe headache.  Your diarrhea gets worse or does not get better.  You have a fever or persistent symptoms for more than 2-3 days.  You have a fever and your symptoms suddenly get worse. MAKE SURE YOU:   Understand these instructions.  Will watch your condition.  Will get help right away if you are not doing well or get worse. Document Released: 07/17/2002 Document Revised: 12/11/2013 Document Reviewed: 04/03/2012 [facility] Patient Information 2015 [place], [place]. This information is not intended to replace advice given  to you by your health care provider. Make sure you discuss any questions you have with your health care provider.

## 2015-04-05 NOTE — ED Notes (Signed)
Pt can not find prescription from last night.  Called Keflex 500mg  capsule take 1 capsule 2 times per day.  Dispense 20 capsules with no refills.  Called to , [employer]

## 2015-04-13 ENCOUNTER — Telehealth (HOSPITAL_COMMUNITY): Payer: Self-pay | Admitting: Emergency Medicine

## 2015-04-13 NOTE — Telephone Encounter (Signed)
Post ED Visit - Positive Culture Follow-up: Successful Patient Follow-Up  Positive Urine culture, + slamonella  []  Patient discharged without antimicrobial prescription and treatment is now indicated []  Organism is resistant to prescribed ED discharge antimicrobial []  Patient with positive blood cultures  Changes discussed with ED provider: , [place] New antibiotic prescription: Bactrim DS one PO BID x seven days Called to [facility] [phone]  Contacted patient, date 04/13/15, time 1030   [facility] 04/13/2015, 10:31 AM

## 2015-04-29 LAB — URINE CULTURE: Culture: >=100000
